# Patient Record
Sex: Female | Born: 1995 | Race: White | Hispanic: No | Marital: Single | Smoking: Never smoker
Health system: Southern US, Community
[De-identification: ages and names within clinical notes are randomized; demographics above are authoritative.]

## PROBLEM LIST (undated history)

## (undated) DIAGNOSIS — F329 Major depressive disorder, single episode, unspecified: Secondary | ICD-10-CM

## (undated) DIAGNOSIS — F32A Depression, unspecified: Secondary | ICD-10-CM

## (undated) DIAGNOSIS — T8859XA Other complications of anesthesia, initial encounter: Secondary | ICD-10-CM

## (undated) DIAGNOSIS — F411 Generalized anxiety disorder: Secondary | ICD-10-CM

## (undated) DIAGNOSIS — S62102A Fracture of unspecified carpal bone, left wrist, initial encounter for closed fracture: Secondary | ICD-10-CM

## (undated) DIAGNOSIS — S62109A Fracture of unspecified carpal bone, unspecified wrist, initial encounter for closed fracture: Secondary | ICD-10-CM

## (undated) HISTORY — DX: Generalized anxiety disorder: F41.1

## (undated) HISTORY — DX: Fracture of unspecified carpal bone, left wrist, initial encounter for closed fracture: S62.102A

## (undated) HISTORY — DX: Major depressive disorder, single episode, unspecified: F32.9

## (undated) HISTORY — DX: Depression, unspecified: F32.A

---
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
## 2017-02-20 ENCOUNTER — Encounter (HOSPITAL_COMMUNITY): Payer: Self-pay | Admitting: Emergency Medicine

## 2017-02-20 ENCOUNTER — Emergency Department (HOSPITAL_COMMUNITY)
Admission: EM | Admit: 2017-02-20 | Discharge: 2017-02-20 | Disposition: A | Discharge status: Home / Self Care | Payer: No Typology Code available for payment source | Attending: Emergency Medicine | Admitting: Emergency Medicine

## 2017-02-20 DIAGNOSIS — Y9389 Activity, other specified: Secondary | ICD-10-CM | POA: Insufficient documentation

## 2017-02-20 DIAGNOSIS — Y92410 Unspecified street and highway as the place of occurrence of the external cause: Secondary | ICD-10-CM | POA: Diagnosis not present

## 2017-02-20 DIAGNOSIS — Y999 Unspecified external cause status: Secondary | ICD-10-CM | POA: Diagnosis not present

## 2017-02-20 DIAGNOSIS — S0990XA Unspecified injury of head, initial encounter: Secondary | ICD-10-CM

## 2017-02-20 MED ORDER — ONDANSETRON 4 MG PO TBDP: 4.0000 mg | ORAL_TABLET | Freq: Three times a day (TID) | ORAL | 0 refills | Status: DC | PRN

## 2017-02-20 MED ORDER — NAPROXEN 500 MG PO TABS: 500.0000 mg | ORAL_TABLET | Freq: Two times a day (BID) | ORAL | 0 refills | Status: DC
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
## 2017-02-20 NOTE — Discharge Instructions (Signed)
Return to the ED with any concerns including vomiting, seizure activity, decreased level of alertness/lethargy, or any other alarming symptoms °

## 2017-02-20 NOTE — ED Notes (Signed)
Pt is in stable condition upon d/c and ambulates from ED. 
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
## 2020-12-28 ENCOUNTER — Encounter: Payer: Self-pay | Admitting: *Deleted

## 2020-12-29 ENCOUNTER — Encounter: Payer: Self-pay | Admitting: Cardiology

## 2020-12-29 ENCOUNTER — Ambulatory Visit: Payer: Self-pay | Admitting: Cardiology

## 2020-12-29 NOTE — Progress Notes (Deleted)
Cardiology Office Note  Date: 12/29/2020   ID: Stacie [name], DOB [birth year], MRN [id number]  PCP:  Richardean Chimera, MD  Cardiologist:  Nona Dell, MD Electrophysiologist:  None   No chief complaint on file.   History of Present Illness: Stacie [name] is a 25 y.o. female referred for cardiology consultation by Ms. Worley PA-C with [facility], history of palpitations and reported syncope.  Need family history  Past Medical History:  Diagnosis Date  . Depression   . GAD (generalized anxiety disorder)   . Left wrist fracture     Past Surgical History:  Procedure Laterality Date  . CLOSED REDUCTION WRIST FRACTURE Right 08/25/2013   Procedure: CLOSED REDUCTION WRIST;  Surgeon: Dominica Severin, MD;  Location: [facility] OR;  Service: Orthopedics;  Laterality: Right;  . KNEE SURGERY Right April 2013    Current Outpatient Medications  Medication Sig Dispense Refill  . clindamycin-benzoyl peroxide (BENZACLIN) gel Apply 1 application topically daily.    Marland Kitchen HYDROcodone-acetaminophen (NORCO/VICODIN) 5-325 MG per tablet Take 1 tablet by mouth every 4 (four) hours as needed. 30 tablet 0  . levonorgestrel-ethinyl estradiol (NORDETTE) 0.15-30 MG-MCG tablet Take 1 tablet by mouth daily.    . methocarbamol (ROBAXIN) 500 MG tablet Take 1 tablet (500 mg total) by mouth every 6 (six) hours as needed. 30 tablet 0  . naproxen (NAPROSYN) 500 MG tablet Take 1 tablet (500 mg total) by mouth 2 (two) times daily. 30 tablet 0  . ondansetron (ZOFRAN ODT) 4 MG disintegrating tablet Take 1 tablet (4 mg total) by mouth every 8 (eight) hours as needed for nausea or vomiting. 20 tablet 0   No current facility-administered medications for this visit.   Allergies:  Patient has no known allergies.   Social History: The patient  reports that she has never smoked. She has never used smokeless tobacco. She reports current drug use. Drug: Marijuana. She reports that she does not drink alcohol.   Family  History: The patient's family history is not on file.   ROS:  Please see the history of present illness. Otherwise, complete review of systems is positive for {NONE DEFAULTED:18576::"none"}.  All other systems are reviewed and negative.   Physical Exam: VS:  There were no vitals taken for this visit., BMI There is no height or weight on file to calculate BMI.  Wt Readings from Last 3 Encounters:  02/20/17 215 lb (97.5 kg) (98 %, Z= 2.15)*  08/26/13 160 lb (72.6 kg) (94 %, Z= 1.52)*   * Growth percentiles are based on CDC (Girls, 2-20 Years) data.    General: Patient appears comfortable at rest. HEENT: Conjunctiva and lids normal, oropharynx clear with moist mucosa. Neck: Supple, no elevated JVP or carotid bruits, no thyromegaly. Lungs: Clear to auscultation, nonlabored breathing at rest. Cardiac: Regular rate and rhythm, no S3 or significant systolic murmur, no pericardial rub. Abdomen: Soft, nontender, no hepatomegaly, bowel sounds present, no guarding or rebound. Extremities: No pitting edema, distal pulses 2+. Skin: Warm and dry. Musculoskeletal: No kyphosis. Neuropsychiatric: Alert and oriented x3, affect grossly appropriate.  ECG:  An ECG dated December 2021 was personally reviewed today and demonstrated:  Sinus arrhythmia.  Recent Labwork:  November 2021: BUN 5, creatinine 0.7, potassium 4.5, AST 25, ALT 28, hemoglobin 13, platelets 212, TSH 1.49  Other Studies Reviewed Today:  No prior cardiac testing for review.  Assessment and Plan:    Medication Adjustments/Labs and Tests Ordered: Current medicines are reviewed at length with the patient  today.  Concerns regarding medicines are outlined above.   Tests Ordered: No orders of the defined types were placed in this encounter.   Medication Changes: No orders of the defined types were placed in this encounter.   Disposition:  Follow up {follow up:15908}  Signed, Jonelle Sidle, MD, [facility] 12/29/2020 2:45 PM     [facility]  [facility] [address] [place], [place], [place] [address] Phone: [phone]; Fax: [phone]

## 2021-07-06 LAB — OB RESULTS CONSOLE HEPATITIS B SURFACE ANTIGEN: Hepatitis B Surface Ag: NEGATIVE

## 2021-07-06 LAB — OB RESULTS CONSOLE GC/CHLAMYDIA
Chlamydia: NEGATIVE
Gonorrhea: NEGATIVE

## 2021-07-06 LAB — OB RESULTS CONSOLE VARICELLA ZOSTER ANTIBODY, IGG: Varicella: IMMUNE

## 2021-07-06 LAB — OB RESULTS CONSOLE RUBELLA ANTIBODY, IGM: Rubella: IMMUNE

## 2021-07-06 LAB — OB RESULTS CONSOLE RPR: RPR: NONREACTIVE

## 2021-11-18 ENCOUNTER — Encounter (HOSPITAL_COMMUNITY): Payer: Self-pay | Admitting: Obstetrics

## 2021-11-18 ENCOUNTER — Inpatient Hospital Stay (HOSPITAL_COMMUNITY)
Admission: AD | Admit: 2021-11-18 | Discharge: 2021-11-18 | Disposition: A | Discharge status: Home / Self Care | Payer: Managed Care, Other (non HMO) | Attending: Obstetrics | Admitting: Obstetrics

## 2021-11-18 ENCOUNTER — Other Ambulatory Visit: Payer: Self-pay

## 2021-11-18 DIAGNOSIS — O133 Gestational [pregnancy-induced] hypertension without significant proteinuria, third trimester: Secondary | ICD-10-CM | POA: Diagnosis present

## 2021-11-18 DIAGNOSIS — R519 Headache, unspecified: Secondary | ICD-10-CM

## 2021-11-18 DIAGNOSIS — Z3A29 29 weeks gestation of pregnancy: Secondary | ICD-10-CM

## 2021-11-18 DIAGNOSIS — O26893 Other specified pregnancy related conditions, third trimester: Secondary | ICD-10-CM

## 2021-11-18 LAB — COMPREHENSIVE METABOLIC PANEL
ALT: 25 U/L (ref 0–44)
AST: 18 U/L (ref 15–41)
Albumin: 2.9 g/dL — ABNORMAL LOW (ref 3.5–5.0)
Alkaline Phosphatase: 88 U/L (ref 38–126)
Anion gap: 9 (ref 5–15)
BUN: 6 mg/dL (ref 6–20)
CO2: 20 mmol/L — ABNORMAL LOW (ref 22–32)
Calcium: 8.9 mg/dL (ref 8.9–10.3)
Chloride: 108 mmol/L (ref 98–111)
Creatinine, Ser: 0.58 mg/dL (ref 0.44–1.00)
GFR, Estimated: >60 mL/min (ref >60)
Glucose, Bld: 84 mg/dL (ref 70–99)
Potassium: 3.6 mmol/L (ref 3.5–5.1)
Sodium: 137 mmol/L (ref 135–145)
Total Bilirubin: 0.3 mg/dL (ref 0.3–1.2)
Total Protein: 6.4 g/dL — ABNORMAL LOW (ref 6.5–8.1)

## 2021-11-18 LAB — CBC
HCT: 34.1 % — ABNORMAL LOW (ref 36.0–46.0)
Hemoglobin: 11 g/dL — ABNORMAL LOW (ref 12.0–15.0)
MCH: 26.7 pg (ref 26.0–34.0)
MCHC: 32.3 g/dL (ref 30.0–36.0)
MCV: 82.8 fL (ref 80.0–100.0)
Platelets: 221 10*3/uL (ref 150–400)
RBC: 4.12 MIL/uL (ref 3.87–5.11)
RDW: 13.2 % (ref 11.5–15.5)
WBC: 13.1 10*3/uL — ABNORMAL HIGH (ref 4.0–10.5)
nRBC: 0 % (ref 0.0–0.2)

## 2021-11-18 LAB — URINALYSIS, ROUTINE W REFLEX MICROSCOPIC
Bilirubin Urine: NEGATIVE
Glucose, UA: NEGATIVE mg/dL
Hgb urine dipstick: NEGATIVE
Ketones, ur: NEGATIVE mg/dL
Leukocytes,Ua: NEGATIVE
Nitrite: NEGATIVE
Protein, ur: NEGATIVE mg/dL
Specific Gravity, Urine: 1.01 (ref 1.005–1.030)
pH: 6.5 (ref 5.0–8.0)

## 2021-11-18 LAB — PROTEIN / CREATININE RATIO, URINE
Creatinine, Urine: 51.64 mg/dL
Total Protein, Urine: <6 mg/dL

## 2021-11-18 MED ORDER — METOCLOPRAMIDE HCL 10 MG PO TABS: 10.0000 mg | ORAL_TABLET | Freq: Four times a day (QID) | ORAL | 0 refills | Status: DC | PRN

## 2021-11-18 MED ORDER — ACETAMINOPHEN 325 MG PO TABS
650.0000 mg | ORAL_TABLET | Freq: Once | ORAL | Status: AC
Start: 2021-11-18 — End: 2021-11-18
  Administered 2021-11-18: 650 mg via ORAL
  Filled 2021-11-18: qty 2

## 2021-11-18 NOTE — MAU Note (Signed)
Stacie [name] is a 25 y.o. at [redacted]w[redacted]d here in MAU reporting: Symptomatic yesterday around 3-4pm. HA, spots in vision that comes and goes.  Onset of complaint: 12/14 @ 3pm Pain score: 3/10 Vitals:   11/18/21 1954  BP: (!) 156/82  Pulse: (!) 114  Resp: 18  Temp: 98.2 F (36.8 C)  SpO2: 98%     FHT:146 Lab orders placed from triage: urinalysis

## 2021-11-18 NOTE — MAU Provider Note (Addendum)
Patient Stacie [name] is a 25 y.o. G1P0  At [redacted]w[redacted]d here with complaints of elevated BP at work today and at home. She reports that she feels that she has floating spots when her blood pressure spikes; she does not have any right now. She reports that she has a HA right now.  "They never stay more than 5 minutes at a time". She feels like the baby's movements decrease when her BP is elevated.   No elevated blood pressures at [facility]. She took her BP at work today and the readings were 157/85; 142/90. She checked her blood pressure today at the clinic bc she felt like something was "not right". She   Last reading at home was 144/101 at home at 1754.   She denies LOF, VB, dysuria.    She reports that her HA  and BP is situational.  She reports a significant amount of stress at home.  History     CSN: [id number]  Arrival date and time: 11/18/21 1919   Event Date/Time   First Provider Initiated Contact with Patient 11/18/21 2038      Chief Complaint  Patient presents with   Hypertension   Hypertension This is a new problem. The current episode started 1 to 4 weeks ago (reports that her BP is elevated at home due to situation). The problem is unchanged. Associated symptoms include headaches. Pertinent negatives include no blurred vision or shortness of breath.   OB History     Gravida  1   Para      Term      Preterm      AB      Living         SAB      IAB      Ectopic      Multiple      Live Births              Past Medical History:  Diagnosis Date   Depression    GAD (generalized anxiety disorder)    Left wrist fracture     Past Surgical History:  Procedure Laterality Date   CLOSED REDUCTION WRIST FRACTURE Right 08/25/2013   Procedure: CLOSED REDUCTION WRIST;  Surgeon: Dominica Severin, MD;  Location: [facility] OR;  Service: Orthopedics;  Laterality: Right;   KNEE SURGERY Right April 2013    History reviewed. No pertinent family  history.  Social History   Tobacco Use   Smoking status: Never   Smokeless tobacco: Never  Vaping Use   Vaping Use: Some days  Substance Use Topics   Alcohol use: No   Drug use: Yes    Types: Marijuana    Allergies:  Allergies  Allergen Reactions   Latex Rash    Medications Prior to Admission  Medication Sig Dispense Refill Last Dose   ondansetron (ZOFRAN ODT) 4 MG disintegrating tablet Take 1 tablet (4 mg total) by mouth every 8 (eight) hours as needed for nausea or vomiting. 20 tablet 0 Past Week   clindamycin-benzoyl peroxide (BENZACLIN) gel Apply 1 application topically daily.      HYDROcodone-acetaminophen (NORCO/VICODIN) 5-325 MG per tablet Take 1 tablet by mouth every 4 (four) hours as needed. 30 tablet 0    levonorgestrel-ethinyl estradiol (NORDETTE) 0.15-30 MG-MCG tablet Take 1 tablet by mouth daily.      methocarbamol (ROBAXIN) 500 MG tablet Take 1 tablet (500 mg total) by mouth every 6 (six) hours as needed. 30 tablet 0    naproxen (NAPROSYN)  500 MG tablet Take 1 tablet (500 mg total) by mouth 2 (two) times daily. 30 tablet 0     Review of Systems  Constitutional: Negative.   HENT: Negative.    Eyes:  Negative for blurred vision.  Respiratory:  Negative for shortness of breath.   Cardiovascular: Negative.   Gastrointestinal: Negative.   Genitourinary: Negative.   Musculoskeletal: Negative.   Neurological:  Positive for headaches.  Psychiatric/Behavioral: Negative.    Physical Exam   Blood pressure 134/71, pulse 79, temperature 98.2 F (36.8 C), temperature source Oral, resp. rate 18, height 5\' 10"  (1.778 m), weight 105.9 kg, SpO2 98 %.  Physical Exam Constitutional:      Appearance: Normal appearance.  Cardiovascular:     Rate and Rhythm: Normal rate.  Pulmonary:     Effort: Pulmonary effort is normal.  Abdominal:     General: Abdomen is flat.  Skin:    General: Skin is warm.     Capillary Refill: Capillary refill takes less than 2 seconds.   Neurological:     Mental Status: She is alert.  Psychiatric:        Mood and Affect: Mood normal.    MAU Course  Procedures  MDM -will draw pre-e labs  -will order tylenol for patient  -NST :  Assessment and Plan  Patient care endorsed to [facility] [place] 11/18/2021, 11:34 PM   MDM: Pt reports complete resolution of h/a with Tylenol.  PEC labs wnl.  Discussed pt stress at home. Dx today is transient HTN, does not meet critera for GHTN with BP usually normal in the office.  Will continue to watch.  Pt to keep prenatal appts as scheduled. Take Tylenol PRN for h/a, and Rx for Reglan sent for headache tx as well.  Return to MAU as needed for emergencies.   A/P: 1. Headache in pregnancy, third trimester   2. Transient hypertension of pregnancy in third trimester   3. [redacted] weeks gestation of pregnancy     D/C home with HTN precautions  11/20/2021, CNM 11:34 PM

## 2021-12-05 NOTE — L&D Delivery Note (Signed)
Delivery Note At 2:41 PM a viable and healthy female was delivered via Vaginal, Spontaneous (Presentation: Right Occiput Anterior).  APGAR: 8, 9; weight 8 lb 3.2 oz (3720 g).   Placenta status: Spontaneous, Intact.  Cord: 3 vessels a body cord  The patient pushed for approximately 30 minutes and delivered a vigorous female infant in the vertex ROA presentation with Apgar scores of 8 at 1 minute and 9 at 5 minutes.  The infant was passed to the waiting maternal abdomen.  Following a 1 minute delay in cord clamping, the umbilical cord was clamped and cut.  The placenta was delivered spontaneously, intact, with three-vessel cord.  A second-degree perineal laceration was repaired with 3-0 Vicryl.  A small amount of residual oozing was noted at the inferior portion of the perineal laceration.  A figure-of-eight suture was placed.  1 g of TXA was administered.  Mom and baby are doing well following delivery.  Anesthesia: Epidural Episiotomy: None Lacerations: 2nd degree Suture Repair: 3.0 vicryl Est. Blood Loss (mL): 307  Mom to postpartum.  Baby to Couplet care / Skin to Skin.  Waynard Reeds 01/24/2022, 3:58 PM

## 2022-01-13 LAB — OB RESULTS CONSOLE GBS: GBS: NEGATIVE

## 2022-01-23 ENCOUNTER — Inpatient Hospital Stay (HOSPITAL_COMMUNITY)
Admission: AD | Admit: 2022-01-23 | Discharge: 2022-01-25 | DRG: 807 | Disposition: A | Discharge status: Home / Self Care | Payer: Medicaid Other | Source: Ambulatory Visit | Attending: Obstetrics and Gynecology | Admitting: Obstetrics and Gynecology

## 2022-01-23 ENCOUNTER — Encounter (HOSPITAL_COMMUNITY): Payer: Self-pay | Admitting: Obstetrics and Gynecology

## 2022-01-23 ENCOUNTER — Other Ambulatory Visit: Payer: Self-pay

## 2022-01-23 DIAGNOSIS — O134 Gestational [pregnancy-induced] hypertension without significant proteinuria, complicating childbirth: Secondary | ICD-10-CM | POA: Diagnosis present

## 2022-01-23 DIAGNOSIS — O133 Gestational [pregnancy-induced] hypertension without significant proteinuria, third trimester: Secondary | ICD-10-CM

## 2022-01-23 DIAGNOSIS — O368130 Decreased fetal movements, third trimester, not applicable or unspecified: Secondary | ICD-10-CM | POA: Diagnosis present

## 2022-01-23 DIAGNOSIS — O368190 Decreased fetal movements, unspecified trimester, not applicable or unspecified: Secondary | ICD-10-CM | POA: Diagnosis not present

## 2022-01-23 DIAGNOSIS — O99344 Other mental disorders complicating childbirth: Secondary | ICD-10-CM | POA: Diagnosis present

## 2022-01-23 DIAGNOSIS — F419 Anxiety disorder, unspecified: Secondary | ICD-10-CM | POA: Diagnosis present

## 2022-01-23 DIAGNOSIS — Z20822 Contact with and (suspected) exposure to covid-19: Secondary | ICD-10-CM | POA: Diagnosis present

## 2022-01-23 DIAGNOSIS — Z3A38 38 weeks gestation of pregnancy: Secondary | ICD-10-CM

## 2022-01-23 HISTORY — DX: Other complications of anesthesia, initial encounter: T88.59XA

## 2022-01-23 LAB — COMPREHENSIVE METABOLIC PANEL
ALT: 14 U/L (ref 0–44)
AST: 18 U/L (ref 15–41)
Albumin: 2.9 g/dL — ABNORMAL LOW (ref 3.5–5.0)
Alkaline Phosphatase: 135 U/L — ABNORMAL HIGH (ref 38–126)
Anion gap: 10 (ref 5–15)
BUN: 10 mg/dL (ref 6–20)
CO2: 20 mmol/L — ABNORMAL LOW (ref 22–32)
Calcium: 9.2 mg/dL (ref 8.9–10.3)
Chloride: 107 mmol/L (ref 98–111)
Creatinine, Ser: 0.69 mg/dL (ref 0.44–1.00)
GFR, Estimated: >60 mL/min (ref >60)
Glucose, Bld: 86 mg/dL (ref 70–99)
Potassium: 3.6 mmol/L (ref 3.5–5.1)
Sodium: 137 mmol/L (ref 135–145)
Total Bilirubin: 0.1 mg/dL — ABNORMAL LOW (ref 0.3–1.2)
Total Protein: 6.5 g/dL (ref 6.5–8.1)

## 2022-01-23 LAB — CBC WITH DIFFERENTIAL/PLATELET
Abs Immature Granulocytes: 0.05 10*3/uL (ref 0.00–0.07)
Basophils Absolute: 0 10*3/uL (ref 0.0–0.1)
Basophils Relative: 0 %
Eosinophils Absolute: 0.1 10*3/uL (ref 0.0–0.5)
Eosinophils Relative: 1 %
HCT: 34.7 % — ABNORMAL LOW (ref 36.0–46.0)
Hemoglobin: 10.9 g/dL — ABNORMAL LOW (ref 12.0–15.0)
Immature Granulocytes: 0 %
Lymphocytes Relative: 22 %
Lymphs Abs: 2.5 10*3/uL (ref 0.7–4.0)
MCH: 24.4 pg — ABNORMAL LOW (ref 26.0–34.0)
MCHC: 31.4 g/dL (ref 30.0–36.0)
MCV: 77.8 fL — ABNORMAL LOW (ref 80.0–100.0)
Monocytes Absolute: 0.7 10*3/uL (ref 0.1–1.0)
Monocytes Relative: 6 %
Neutro Abs: 8.1 10*3/uL — ABNORMAL HIGH (ref 1.7–7.7)
Neutrophils Relative %: 71 %
Platelets: 216 10*3/uL (ref 150–400)
RBC: 4.46 MIL/uL (ref 3.87–5.11)
RDW: 15.5 % (ref 11.5–15.5)
WBC: 11.4 10*3/uL — ABNORMAL HIGH (ref 4.0–10.5)
nRBC: 0 % (ref 0.0–0.2)

## 2022-01-23 LAB — TYPE AND SCREEN
ABO/RH(D): A POS
Antibody Screen: NEGATIVE

## 2022-01-23 LAB — RESP PANEL BY RT-PCR (FLU A&B, COVID) ARPGX2
Influenza A by PCR: NEGATIVE
Influenza B by PCR: NEGATIVE
SARS Coronavirus 2 by RT PCR: NEGATIVE

## 2022-01-23 LAB — PROTEIN / CREATININE RATIO, URINE
Creatinine, Urine: 139.02 mg/dL
Protein Creatinine Ratio: 0.14 mg/mg{Cre} (ref 0.00–0.15)
Total Protein, Urine: 19 mg/dL

## 2022-01-23 LAB — POCT FERN TEST: POCT Fern Test: NEGATIVE

## 2022-01-23 MED ORDER — ACETAMINOPHEN 325 MG PO TABS
650.0000 mg | ORAL_TABLET | ORAL | Status: DC | PRN
  Administered 2022-01-24: 650 mg via ORAL
  Filled 2022-01-23: qty 2

## 2022-01-23 MED ORDER — SOD CITRATE-CITRIC ACID 500-334 MG/5ML PO SOLN: 30.0000 mL | ORAL | Status: DC | PRN

## 2022-01-23 MED ORDER — OXYCODONE-ACETAMINOPHEN 5-325 MG PO TABS: 1.0000 | ORAL_TABLET | ORAL | Status: DC | PRN

## 2022-01-23 MED ORDER — OXYCODONE-ACETAMINOPHEN 5-325 MG PO TABS: 2.0000 | ORAL_TABLET | ORAL | Status: DC | PRN

## 2022-01-23 MED ORDER — LACTATED RINGERS IV SOLN: 500.0000 mL | INTRAVENOUS | Status: DC | PRN

## 2022-01-23 MED ORDER — LACTATED RINGERS IV SOLN: INTRAVENOUS | Status: DC

## 2022-01-23 MED ORDER — OXYTOCIN BOLUS FROM INFUSION
333.0000 mL | Freq: Once | INTRAVENOUS | Status: AC
  Administered 2022-01-24: 333 mL via INTRAVENOUS

## 2022-01-23 MED ORDER — FLEET ENEMA 7-19 GM/118ML RE ENEM: 1.0000 | ENEMA | RECTAL | Status: DC | PRN

## 2022-01-23 MED ORDER — OXYTOCIN-SODIUM CHLORIDE 30-0.9 UT/500ML-% IV SOLN
1.0000 m[IU]/min | INTRAVENOUS | Status: DC
  Administered 2022-01-23: 2 m[IU]/min via INTRAVENOUS
  Filled 2022-01-23: qty 500

## 2022-01-23 MED ORDER — ONDANSETRON HCL 4 MG/2ML IJ SOLN
4.0000 mg | Freq: Four times a day (QID) | INTRAMUSCULAR | Status: DC | PRN
  Administered 2022-01-24: 4 mg via INTRAVENOUS
  Filled 2022-01-23: qty 2

## 2022-01-23 MED ORDER — LIDOCAINE HCL (PF) 1 % IJ SOLN
30.0000 mL | INTRAMUSCULAR | Status: AC | PRN
  Administered 2022-01-24: 30 mL via SUBCUTANEOUS
  Filled 2022-01-23: qty 30

## 2022-01-23 MED ORDER — OXYTOCIN-SODIUM CHLORIDE 30-0.9 UT/500ML-% IV SOLN
2.5000 [IU]/h | INTRAVENOUS | Status: DC
  Administered 2022-01-24: 2.5 [IU]/h via INTRAVENOUS
  Filled 2022-01-23: qty 500

## 2022-01-23 MED ORDER — TERBUTALINE SULFATE 1 MG/ML IJ SOLN: 0.2500 mg | Freq: Once | INTRAMUSCULAR | Status: DC | PRN

## 2022-01-23 NOTE — MAU Provider Note (Signed)
Pt informed that the ultrasound is considered a limited OB ultrasound and is not intended to be a complete ultrasound exam.  Patient also informed that the ultrasound is not being completed with the intent of assessing for fetal or placental anomalies or any pelvic abnormalities.  Explained that the purpose of todays ultrasound is to assess for  presentation.  Patient acknowledges the purpose of the exam and the limitations of the study.    Vertex position.    Exam Noni Saupe, NP

## 2022-01-23 NOTE — MAU Provider Note (Signed)
S Stacie [name] is a 25 y.o. G1P0 patient who presents to MAU today with complaint of DFM and elevated BP's.  O BP (!) 147/94    Pulse 92    Temp 98.3 F (36.8 C) (Oral)    Resp 18    Ht 5\' 10"  (1.778 m)    Wt 115 kg    SpO2 99%    BMI 36.37 kg/m   Patient Vitals for the past 24 hrs:  BP Temp Temp src Pulse Resp SpO2 Height Weight  01/23/22 2035 (!) 147/94 -- -- 92 -- 99 % -- --  01/23/22 2014 (!) 142/96 98.3 F (36.8 C) Oral 97 18 -- 5\' 10"  (1.778 m) 115 kg     Physical Exam Skin:    General: Skin is warm.  Neurological:     Mental Status: She is alert and oriented to person, place, and time.  Psychiatric:        Behavior: Behavior normal.    A Medical screening exam complete DFM and GHTN @ [redacted]w[redacted]d  P  Admit to labor. Pre E labs. Fern slide negative     Lezlie Lye, NP 01/23/2022 8:52 PM

## 2022-01-23 NOTE — MAU Note (Signed)
...  Stacie [name] is a 26 y.o. at [redacted]w[redacted]d here in MAU reporting: DFM since 1630 with light to no movement, and reports less movement throughout the day earlier. Pt states around 0900 pt wake up with watery odorless fluid in her underwear, and it has continued all day as she has had to change her underwear 3 times. Pt is not wearing a pad. Pt denies VB, abnormal discharge, no ctx and complications in the pregnancy. Pt has high anxiety throughout the pregnancy.  Last intercourse was 2 weeks ago.  SVE last Thursday was 3cm Onset of complaint: 0900 Pain score: 0/10 Vitals:   01/23/22 2014  BP: (!) 142/96  Pulse: 97  Resp: 18  Temp: 98.3 F (36.8 C)     FHT:140 Lab orders placed from triage:  none

## 2022-01-24 ENCOUNTER — Inpatient Hospital Stay (HOSPITAL_COMMUNITY): Payer: Medicaid Other | Admitting: Anesthesiology

## 2022-01-24 LAB — CBC
HCT: 31.8 % — ABNORMAL LOW (ref 36.0–46.0)
HCT: 30.5 % — ABNORMAL LOW (ref 36.0–46.0)
Hemoglobin: 9.9 g/dL — ABNORMAL LOW (ref 12.0–15.0)
Hemoglobin: 9.6 g/dL — ABNORMAL LOW (ref 12.0–15.0)
MCH: 24.4 pg — ABNORMAL LOW (ref 26.0–34.0)
MCH: 24.5 pg — ABNORMAL LOW (ref 26.0–34.0)
MCHC: 31.1 g/dL (ref 30.0–36.0)
MCHC: 31.5 g/dL (ref 30.0–36.0)
MCV: 78.3 fL — ABNORMAL LOW (ref 80.0–100.0)
MCV: 77.8 fL — ABNORMAL LOW (ref 80.0–100.0)
Platelets: 197 10*3/uL (ref 150–400)
Platelets: 185 10*3/uL (ref 150–400)
RBC: 4.06 MIL/uL (ref 3.87–5.11)
RBC: 3.92 MIL/uL (ref 3.87–5.11)
RDW: 15.6 % — ABNORMAL HIGH (ref 11.5–15.5)
RDW: 15.4 % (ref 11.5–15.5)
WBC: 10.7 10*3/uL — ABNORMAL HIGH (ref 4.0–10.5)
WBC: 12.7 10*3/uL — ABNORMAL HIGH (ref 4.0–10.5)
nRBC: 0 % (ref 0.0–0.2)
nRBC: 0 % (ref 0.0–0.2)

## 2022-01-24 LAB — OB RESULTS CONSOLE GC/CHLAMYDIA
Chlamydia: NEGATIVE
Gonorrhea: NEGATIVE

## 2022-01-24 LAB — RPR: RPR Ser Ql: NONREACTIVE

## 2022-01-24 MED ORDER — COCONUT OIL OIL
1.0000 "application " | TOPICAL_OIL | Status: DC | PRN
  Administered 2022-01-25: 1 via TOPICAL

## 2022-01-24 MED ORDER — EPHEDRINE 5 MG/ML INJ: 10.0000 mg | INTRAVENOUS | Status: DC | PRN

## 2022-01-24 MED ORDER — BENZOCAINE-MENTHOL 20-0.5 % EX AERO
1.0000 "application " | INHALATION_SPRAY | CUTANEOUS | Status: DC | PRN
  Administered 2022-01-24: 1 via TOPICAL
  Filled 2022-01-24: qty 56

## 2022-01-24 MED ORDER — ZOLPIDEM TARTRATE 5 MG PO TABS: 5.0000 mg | ORAL_TABLET | Freq: Every evening | ORAL | Status: DC | PRN

## 2022-01-24 MED ORDER — TRANEXAMIC ACID-NACL 1000-0.7 MG/100ML-% IV SOLN
INTRAVENOUS | Status: AC
  Filled 2022-01-24: qty 100

## 2022-01-24 MED ORDER — ACETAMINOPHEN 325 MG PO TABS
650.0000 mg | ORAL_TABLET | ORAL | Status: DC | PRN
  Administered 2022-01-24 – 2022-01-25 (×2): 650 mg via ORAL
  Filled 2022-01-24 (×2): qty 2

## 2022-01-24 MED ORDER — METHYLERGONOVINE MALEATE 0.2 MG PO TABS: 0.2000 mg | ORAL_TABLET | ORAL | Status: DC | PRN

## 2022-01-24 MED ORDER — SIMETHICONE 80 MG PO CHEW: 80.0000 mg | CHEWABLE_TABLET | ORAL | Status: DC | PRN

## 2022-01-24 MED ORDER — LABETALOL HCL 5 MG/ML IV SOLN: 80.0000 mg | INTRAVENOUS | Status: DC | PRN

## 2022-01-24 MED ORDER — LABETALOL HCL 5 MG/ML IV SOLN: 20.0000 mg | INTRAVENOUS | Status: DC | PRN

## 2022-01-24 MED ORDER — LACTATED RINGERS IV SOLN: 500.0000 mL | Freq: Once | INTRAVENOUS | Status: DC

## 2022-01-24 MED ORDER — HYDRALAZINE HCL 20 MG/ML IJ SOLN: 10.0000 mg | INTRAMUSCULAR | Status: DC | PRN

## 2022-01-24 MED ORDER — DIBUCAINE (PERIANAL) 1 % EX OINT: 1.0000 "application " | TOPICAL_OINTMENT | CUTANEOUS | Status: DC | PRN

## 2022-01-24 MED ORDER — FENTANYL-BUPIVACAINE-NACL 0.5-0.125-0.9 MG/250ML-% EP SOLN
12.0000 mL/h | EPIDURAL | Status: DC | PRN
  Administered 2022-01-24: 12 mL/h via EPIDURAL
  Filled 2022-01-24: qty 250

## 2022-01-24 MED ORDER — TRANEXAMIC ACID-NACL 1000-0.7 MG/100ML-% IV SOLN
1000.0000 mg | INTRAVENOUS | Status: AC
  Administered 2022-01-24: 1000 mg via INTRAVENOUS

## 2022-01-24 MED ORDER — ONDANSETRON HCL 4 MG PO TABS
4.0000 mg | ORAL_TABLET | ORAL | Status: DC | PRN
Start: 2022-01-24 — End: 2022-01-26

## 2022-01-24 MED ORDER — DIPHENHYDRAMINE HCL 25 MG PO CAPS: 25.0000 mg | ORAL_CAPSULE | Freq: Four times a day (QID) | ORAL | Status: DC | PRN

## 2022-01-24 MED ORDER — METHYLERGONOVINE MALEATE 0.2 MG/ML IJ SOLN: 0.2000 mg | INTRAMUSCULAR | Status: DC | PRN

## 2022-01-24 MED ORDER — PHENYLEPHRINE 40 MCG/ML (10ML) SYRINGE FOR IV PUSH (FOR BLOOD PRESSURE SUPPORT): 80.0000 ug | PREFILLED_SYRINGE | INTRAVENOUS | Status: DC | PRN

## 2022-01-24 MED ORDER — ONDANSETRON HCL 4 MG/2ML IJ SOLN: 4.0000 mg | INTRAMUSCULAR | Status: DC | PRN

## 2022-01-24 MED ORDER — SENNOSIDES-DOCUSATE SODIUM 8.6-50 MG PO TABS
2.0000 | ORAL_TABLET | Freq: Every day | ORAL | Status: DC
  Administered 2022-01-25: 2 via ORAL
  Filled 2022-01-24: qty 2

## 2022-01-24 MED ORDER — DIPHENHYDRAMINE HCL 50 MG/ML IJ SOLN: 12.5000 mg | INTRAMUSCULAR | Status: DC | PRN

## 2022-01-24 MED ORDER — FENTANYL CITRATE (PF) 100 MCG/2ML IJ SOLN
100.0000 ug | INTRAMUSCULAR | Status: DC | PRN
  Administered 2022-01-24: 100 ug via INTRAVENOUS
  Filled 2022-01-24: qty 2

## 2022-01-24 MED ORDER — TETANUS-DIPHTH-ACELL PERTUSSIS 5-2.5-18.5 LF-MCG/0.5 IM SUSY: 0.5000 mL | PREFILLED_SYRINGE | Freq: Once | INTRAMUSCULAR | Status: DC

## 2022-01-24 MED ORDER — LIDOCAINE HCL (PF) 1 % IJ SOLN
INTRAMUSCULAR | Status: DC | PRN
Start: 2022-01-24 — End: 2022-01-24
  Administered 2022-01-24: 10 mL via EPIDURAL

## 2022-01-24 MED ORDER — IBUPROFEN 600 MG PO TABS
600.0000 mg | ORAL_TABLET | Freq: Four times a day (QID) | ORAL | Status: DC
  Administered 2022-01-24 – 2022-01-25 (×5): 600 mg via ORAL
  Filled 2022-01-24 (×5): qty 1

## 2022-01-24 MED ORDER — PHENYLEPHRINE 40 MCG/ML (10ML) SYRINGE FOR IV PUSH (FOR BLOOD PRESSURE SUPPORT)
80.0000 ug | PREFILLED_SYRINGE | INTRAVENOUS | Status: AC | PRN
  Administered 2022-01-24 (×3): 80 ug via INTRAVENOUS
  Filled 2022-01-24: qty 10

## 2022-01-24 MED ORDER — LABETALOL HCL 5 MG/ML IV SOLN: 40.0000 mg | INTRAVENOUS | Status: DC | PRN

## 2022-01-24 MED ORDER — PRENATAL MULTIVITAMIN CH
1.0000 | ORAL_TABLET | Freq: Every day | ORAL | Status: DC
  Administered 2022-01-25: 1 via ORAL
  Filled 2022-01-24: qty 1

## 2022-01-24 MED ORDER — WITCH HAZEL-GLYCERIN EX PADS: 1.0000 "application " | MEDICATED_PAD | CUTANEOUS | Status: DC | PRN

## 2022-01-24 NOTE — Lactation Note (Signed)
This note was copied from a baby's chart. Lactation Consultation Note  Patient Name: Stacie [name] [id number] Date: 01/24/2022 Reason for consult: L&D Initial assessment;Mother's request;Primapara;1st time breastfeeding;Early term 37-38.6wks Age:24 hours  LC assisted with latching infant in cross cradle prone with signs of milk transfer.  Infant still feeding at the end of the visit.  Mom to get more LC support on the floor.   Maternal Data    Feeding Mother's Current Feeding Choice: Breast Milk  LATCH Score Latch: Repeated attempts needed to sustain latch, nipple held in mouth throughout feeding, stimulation needed to elicit sucking reflex.  Audible Swallowing: Spontaneous and intermittent  Type of Nipple: Everted at rest and after stimulation  Comfort (Breast/Nipple): Soft / non-tender  Hold (Positioning): Assistance needed to correctly position infant at breast and maintain latch.  LATCH Score: 8   Lactation Tools Discussed/Used    Interventions Interventions: Breast feeding basics reviewed;Assisted with latch;Skin to skin;Adjust position;Education;Infant Driven Feeding Algorithm education  Discharge Pump: Personal  Consult Status Consult Status: Follow-up from L&D Date: 01/25/22 Follow-up type: In-patient    Stacie Egler  [name] 01/24/2022, 3:41 PM

## 2022-01-24 NOTE — H&P (Signed)
MATISHA DEMAYO is a 26 y.o. female presenting for reported DFM this afternoon as well as r/o ROM. Paged on-call earlier regarding possible ROM, thin mucus per patient, clear with no odor. Denies VB, only irr ctx. Was noted to have elevated BP in MAU; no diagnosed HTN but does have significant anxiety  PNC c/b anxiety on Lexapro and PRN hydralazine, h/o tobacco use, infant rt renal pelvis 57mm (pp follow up)  GBS neg  OB History     Gravida  1   Para      Term      Preterm      AB      Living         SAB      IAB      Ectopic      Multiple      Live Births             Past Medical History:  Diagnosis Date   Complication of anesthesia    Depression    GAD (generalized anxiety disorder)    Left wrist fracture    Past Surgical History:  Procedure Laterality Date   CLOSED REDUCTION WRIST FRACTURE Right 08/25/2013   Procedure: CLOSED REDUCTION WRIST;  Surgeon: Roseanne Kaufman, MD;  Location: [facility];  Service: Orthopedics;  Laterality: Right;   KNEE SURGERY Right April 2013   Family History: family history is not on file. Social History:  reports that she has never smoked. She has never used smokeless tobacco. She reports that she does not currently use drugs after having used the following drugs: Marijuana. She reports that she does not drink alcohol.     Maternal Diabetes: No Genetic Screening: Normal Maternal Ultrasounds/Referrals: Fetal renal pyelectasis Fetal Ultrasounds or other Referrals:  None Maternal Substance Abuse:  Yes:  Type: Smoker Significant Maternal Medications:  Meds include: Other: Lexapro Significant Maternal Lab Results:  Group B Strep negative Other Comments:  None  Review of Systems  Constitutional:  Negative for chills and fever.  Respiratory:  Negative for shortness of breath.   Cardiovascular:  Negative for chest pain, palpitations and leg swelling.  Gastrointestinal:  Negative for abdominal pain and vomiting.  Neurological:   Negative for dizziness, weakness and headaches.  Psychiatric/Behavioral:  Negative for suicidal ideas.   Maternal Medical History:  Contractions: Frequency: irregular.   Fetal activity: Perceived fetal activity is decreased.   Last perceived fetal movement was within the past hour.   Prenatal complications: PIH (?white coat vs GHTN).   No bleeding.   Prenatal Complications - Diabetes: none.  Dilation: 3 Effacement (%): 70 Station: -3 Exam by:: Thersa Salt, RN Blood pressure (!) 142/90, pulse 79, temperature 98 F (36.7 C), temperature source Oral, resp. rate 16, height 5\' 10"  (1.778 m), weight 115 kg, SpO2 99 %. Exam Physical Exam Constitutional:      General: She is not in acute distress.    Appearance: She is well-developed.  HENT:     Head: Normocephalic and atraumatic.  Eyes:     Pupils: Pupils are equal, round, and reactive to light.  Cardiovascular:     Rate and Rhythm: Normal rate and regular rhythm.     Heart sounds: No murmur heard.   No gallop.  Abdominal:     Tenderness: There is no abdominal tenderness. There is no guarding or rebound.  Genitourinary:    Vagina: Normal.  Musculoskeletal:        General: Normal range of motion.  Cervical back: Normal range of motion and neck supple.  Skin:    General: Skin is warm and dry.  Neurological:     Mental Status: She is alert and oriented to person, place, and time.    Prenatal labs: ABO, Rh: --/--/A POS (02/19 2115) Antibody: NEG (02/19 2115) Rubella:  imm RPR:   nr HBsAg:   neg HIV:   nr GBS:   NEG Cat 1 tracing TOCO w/ irritability Assessment/Plan: This is a 26yo G1P0 @ 31 6/7 with newly diagnosed GHTN (PreE labs WNL, asx) as wlel as DFM at term. Admitted for IOL, GBS neg. Plan for pitocin per protocol. Amenable to nitrous vs epidural. Anticipate SVD   Melida Quitter Loranda Mastel 01/24/2022, 2:10 AM

## 2022-01-24 NOTE — Lactation Note (Signed)
This note was copied from a baby's chart. Lactation Consultation Note  Patient Name: Stacie [name] [id number] Date: 01/24/2022 Reason for consult: Initial assessment;1st time breastfeeding;Early term 37-38.6wks Age:24 hours P1, ETI female infant. Per mom, she feels breastfeeding is going well she feels tug and no pain with latch. Infant was latched prior to [facility] entering the  room, infant BF for 15 mins on mom's right breast using cradle hold position. While LC was in room mom switched infant to her left breast using cradle hold postion, infant was still breastfeeding after 20 minutes. Mom knows to attempt to latch infant on both breast during a feeding. Mom will continue to breastfeed infant according to hunger cues, 8 to 12+ times within 24 hours, skin to skin. Mom knows to call RN/LC if she has any breastfeeding questions, concerns or need assistance with latching infant at the breast.  Mom made aware of O/P services, breastfeeding support groups, community resources, and our phone # for post-discharge questions.   Maternal Data Has patient been taught Hand Expression?: Yes Does the patient have breastfeeding experience prior to this delivery?: No  Feeding Mother's Current Feeding Choice: Breast Milk  LATCH Score Latch: Grasps breast easily, tongue down, lips flanged, rhythmical sucking.  Audible Swallowing: Spontaneous and intermittent  Type of Nipple: Everted at rest and after stimulation  Comfort (Breast/Nipple): Soft / non-tender  Hold (Positioning): No assistance needed to correctly position infant at breast.  LATCH Score: 10   Lactation Tools Discussed/Used    Interventions Interventions: Breast feeding basics reviewed;Skin to skin;Hand express;Breast compression;Adjust position;Support pillows;Position options;Education;LC Services brochure  Discharge    Consult Status Consult Status: Follow-up Date: 01/25/22 Follow-up type: In-patient    [name] 01/24/2022, 9:00 PM

## 2022-01-24 NOTE — Progress Notes (Signed)
Patient ID: Stacie [name], female   DOB: [birth year], 26 y.o.   MRN: [id number]  S: feeling pain with contractions  O:  Vitals:   01/24/22 0832 01/24/22 0900 01/24/22 0930 01/24/22 0936  BP: 139/82 (!) 143/75 137/81   Pulse: 64 64 68   Resp: 18 18 18    Temp:    97.9 F (36.6 C)  TempSrc:    Oral  SpO2:      Weight:      Height:       AOx3, NAD  Cvx 5-6/C/-2 Toco Q2-4 FHR 130 reactive, cat 1 tracing  AROM clear fluid Epidural on request FWB reassuring

## 2022-01-24 NOTE — Anesthesia Postprocedure Evaluation (Signed)
Anesthesia Post Note  Patient: Stacie [name]  Procedure(s) Performed: AN AD HOC LABOR EPIDURAL     Patient location during evaluation: Mother Baby Anesthesia Type: Epidural Level of consciousness: awake and alert Pain management: pain level controlled Vital Signs Assessment: post-procedure vital signs reviewed and stable Respiratory status: spontaneous breathing, nonlabored ventilation and respiratory function stable Cardiovascular status: stable Postop Assessment: no headache, no backache and epidural receding Anesthetic complications: no   No notable events documented.  Last Vitals:  Vitals:   01/24/22 1805 01/24/22 1825  BP: 139/69 (!) 125/59  Pulse: 73 68  Resp: 16 16  Temp: 36.9 C   SpO2: 99% 99%    Last Pain:  Vitals:   01/24/22 1915  TempSrc:   PainSc: 0-No pain   Pain Goal:                   [employer]

## 2022-01-24 NOTE — Anesthesia Preprocedure Evaluation (Signed)
Anesthesia Evaluation  Patient identified by MRN, date of birth, ID band Patient awake    Reviewed: Allergy & Precautions, H&P , Patient's Chart, lab work & pertinent test results  Airway Mallampati: I       Dental no notable dental hx.    Pulmonary neg pulmonary ROS,    Pulmonary exam normal        Cardiovascular negative cardio ROS Normal cardiovascular exam     Neuro/Psych negative neurological ROS     GI/Hepatic negative GI ROS, Neg liver ROS,   Endo/Other  negative endocrine ROS  Renal/GU negative Renal ROS  negative genitourinary   Musculoskeletal   Abdominal (+) + obese,   Peds  Hematology negative hematology ROS (+)   Anesthesia Other Findings   Reproductive/Obstetrics (+) Pregnancy                             Anesthesia Physical Anesthesia Plan  ASA: 2  Anesthesia Plan: Epidural   Post-op Pain Management:    Induction:   PONV Risk Score and Plan:   Airway Management Planned:   Additional Equipment:   Intra-op Plan:   Post-operative Plan:   Informed Consent: I have reviewed the patients History and Physical, chart, labs and discussed the procedure including the risks, benefits and alternatives for the proposed anesthesia with the patient or authorized representative who has indicated his/her understanding and acceptance.       Plan Discussed with:   Anesthesia Plan Comments:         Anesthesia Quick Evaluation

## 2022-01-24 NOTE — Anesthesia Procedure Notes (Signed)
Epidural Patient location during procedure: OB Start time: 01/24/2022 10:10 AM End time: 01/24/2022 10:14 AM  Staffing Anesthesiologist: Leilani Able, MD Performed: anesthesiologist   Preanesthetic Checklist Completed: patient identified, IV checked, site marked, risks and benefits discussed, surgical consent, monitors and equipment checked, pre-op evaluation and timeout performed  Epidural Patient position: sitting Prep: DuraPrep and site prepped and draped Patient monitoring: continuous pulse ox and blood pressure Approach: midline Location: L3-L4 Injection technique: LOR air  Needle:  Needle type: Tuohy  Needle gauge: 17 G Needle length: 9 cm and 9 Needle insertion depth: 7 cm Catheter type: closed end flexible Catheter size: 19 Gauge Catheter at skin depth: 12 cm Test dose: negative and Other  Assessment Events: blood not aspirated, injection not painful, no injection resistance, no paresthesia and negative IV test  Additional Notes Reason for block:procedure for pain

## 2022-01-25 ENCOUNTER — Encounter (HOSPITAL_COMMUNITY): Payer: Self-pay | Admitting: Obstetrics and Gynecology

## 2022-01-25 LAB — CBC
HCT: 27.7 % — ABNORMAL LOW (ref 36.0–46.0)
Hemoglobin: 8.8 g/dL — ABNORMAL LOW (ref 12.0–15.0)
MCH: 24.8 pg — ABNORMAL LOW (ref 26.0–34.0)
MCHC: 31.8 g/dL (ref 30.0–36.0)
MCV: 78 fL — ABNORMAL LOW (ref 80.0–100.0)
Platelets: 171 10*3/uL (ref 150–400)
RBC: 3.55 MIL/uL — ABNORMAL LOW (ref 3.87–5.11)
RDW: 15.4 % (ref 11.5–15.5)
WBC: 11.4 10*3/uL — ABNORMAL HIGH (ref 4.0–10.5)
nRBC: 0 % (ref 0.0–0.2)

## 2022-01-25 MED ORDER — IBUPROFEN 600 MG PO TABS: 600.0000 mg | ORAL_TABLET | Freq: Four times a day (QID) | ORAL | 0 refills | Status: AC

## 2022-01-25 MED ORDER — ACETAMINOPHEN 325 MG PO TABS: 650.0000 mg | ORAL_TABLET | ORAL | 1 refills | Status: AC | PRN

## 2022-01-25 NOTE — Clinical Social Work Maternal (Signed)
CLINICAL SOCIAL WORK MATERNAL/CHILD NOTE  Patient Details  Name: Stacie [name] MRN: [id number] Date of Birth: 06-13-1996  Date:  01/25/2022  Clinical Social Worker Initiating Note:  Darra Lis, [place] Date/Time: Initiated:  01/25/22/1045     Child's Name:  Stacie [name]   Biological Parents:  Mother, Father Kathryne Hitch)   Need for Interpreter:  None   Reason for Referral:  Behavioral Health Concerns, Current Substance Use/Substance Use During Pregnancy     Address:  [address] [place] [address]    Phone number:  [phone] (home)     Additional phone number:   Household Members/Support Persons (HM/SP):   Household Member/Support Person 1, Household Member/Support Person 2, Household Member/Support Person 3   HM/SP Name Relationship DOB or Age  HM/SP -1 Dylan Oakes Significant Other    HM/SP -[place] Mother 78  HM/SP -[place] Stepfather Unknown  HM/SP -4        HM/SP -5        HM/SP -6        HM/SP -7        HM/SP -8          Natural Supports (not living in the home):  Immediate Family   Professional Supports: None   Employment: Full-time   Type of Work: Scientist, forensic   Education:  [place] arranged:    Museum/gallery curator Resources:  Multimedia programmer    Other Resources:  Physicist, medical   (Provided information on [employer])   Cultural/Religious Considerations Which May Impact Care:    Strengths:  Ability to meet basic needs  , Home prepared for child  , Pediatrician chosen   Psychotropic Medications:         Pediatrician:    [employer]  Pediatrician List:   [place]      Pediatrician Fax Number:    Risk Factors/Current Problems:  None   Cognitive State:  Alert  , Insightful  , Linear Thinking     Mood/Affect:  Happy  , Interested     CSW Assessment: CSW consulted for anxiety,  depression and THC. CSW met with MOB assess and offer support. CSW introduced self and role. CSW observed FOB present, holding baby 'Tillie'. CSW offered to speak with MOB in private, however MOB declined and stated FOB could remain in the room. CSW informed MOB of the reason for consult and assessed current mood. MOB was engaged and pleasant as she expressed she is doing well. MOB shared the pregnancy was physically rough. CSW inquired on MOB mental health history. MOB stated she was diagnosed with anxiety and depression when she was 15. MOB reported she mostly experienced anxiety during the pregnancy. MOB shared she tried Lexapro and Hydroxyzine during the pregnancy, however it made her sick. CSW asked MOB how she copes, MOB stated she talks to FOB who provides reasoning. MOB reported she has tried therapy in the past but she did not like it. MOB was receptive to resources and agreed to notify a professional if mental health needs arise. MOB identified FOB and her parents as supports. MOB denies any current SI or HI.   CSW inquired on substance use. MOB reported she smoked THC during the pregnancy to help with nausea. MOB stated she did not smoke a lot and last smoked once in  December. MOB denies any additional substance use. CSW informed MOB of the hospital drug screen policy. MOB aware infant UDS is negative and the CDS will be followed. CSW informed MOB that a CPS report will be made if infant test positive for substances. MOB denied any questions.   CSW provided education regarding the baby blues period versus PPD. CSW provided the New Mom Checklist and encouraged MOB to self evaluate.  CSW provided review of Sudden Infant Death Syndrome (SIDS) precautions. MOB reported she has a crib and all infant essentials. MOB denies any barriers to infant follow-up care. MOB reported no additional concerns at this time.  CSW will continue to follow CDS and make a CPS report if warranted. CSW identifies no further  need for intervention and no barriers to discharge at this time.  CSW Plan/Description:  No Further Intervention Required/No Barriers to Discharge, CSW Will Continue to Monitor Umbilical Cord Tissue Drug Screen Results and Make Report if Warranted, Child Protective Service Report  , [place], Perinatal Mood and Anxiety Disorder (PMADs) Education, Sudden Infant Death Syndrome (SIDS) Education, Other Information/Referral to [employer], Other Patient/Family Education    [name], [place] 01/25/2022, 11:17 AM

## 2022-01-25 NOTE — Lactation Note (Signed)
This note was copied from a baby's chart. Lactation Consultation Note  Patient Name: [name] [id number] Date: 01/25/2022 Reason for consult: Follow-up assessment;1st time breastfeeding;Primapara;Early term 37-38.6wks;Infant weight loss;Other (Comment) (3 % weight loss, For D/C today . LC reviewed and updated the doc flow sheets per mom and dad. LC reviewed BF D/C teaching. per mom breast feeding is going well and baby is breast feeding both breast.  mom aware of her [place] resources after D/C.) Age:54 hours  Maternal Data    Feeding Mother's Current Feeding Choice: Breast Milk  LATCH Score   Lactation Tools Discussed/Used    Interventions    Discharge Discharge Education: Engorgement and breast care;Warning signs for feeding baby Pump: Personal;Manual;DEBP  Consult Status Consult Status: Complete Date: 01/25/22    Myer Haff 01/25/2022, 4:20 PM

## 2022-01-25 NOTE — Progress Notes (Addendum)
Post Partum Day 1 Subjective: no complaints, up ad lib, voiding, and tolerating PO.  States baby latching well and would like early d/c today  Objective: Blood pressure 132/77, pulse 73, temperature 97.9 F (36.6 C), temperature source Oral, resp. rate 18, height 5\' 10"  (1.778 m), weight 115 kg, SpO2 100 %, unknown if currently breastfeeding.  Physical Exam:  General: alert and cooperative Lochia: appropriate Uterine Fundus: firm   Recent Labs    01/24/22 1553 01/25/22 0531  HGB 9.6* 8.8*  HCT 30.5* 27.7*    Assessment/Plan: Discharge home if baby able to go All BP have been normal since delivery   LOS: 2 days   01/27/22 01/25/2022, 11:06 AM

## 2022-01-25 NOTE — Discharge Summary (Signed)
Postpartum Discharge Summary       Patient Name: Stacie [name] DOB: Jun 20, 1996 MRN: [id number]  Date of admission: 01/23/2022 Delivery date:01/24/2022  Delivering provider: Waynard Reeds  Date of discharge: 01/25/2022  Admitting diagnosis: Indication for care in labor or delivery [O75.9] Spontaneous vaginal delivery [O80] Intrauterine pregnancy: [redacted]w[redacted]d     Secondary diagnosis:  Principal Problem:   Indication for care in labor or delivery Active Problems:   Spontaneous vaginal delivery  Additional problems: gestational HTN    Discharge diagnosis: Term Pregnancy Delivered and Gestational Hypertension                                              Post partum procedures: none Augmentation: AROM and Pitocin Complications: None  Hospital course: Induction of Labor With Vaginal Delivery   26 y.o. yo G1P1001 at [redacted]w[redacted]d was admitted to the hospital 01/23/2022 for induction of labor.  Indication for induction: Gestational hypertension.  Patient had an uncomplicated labor course as follows: Membrane Rupture Time/Date: 9:36 AM ,01/24/2022   Delivery Method:Vaginal, Spontaneous  Episiotomy: None  Lacerations:  2nd degree  Details of delivery can be found in separate delivery note.  Patient had a routine postpartum course. Patient is discharged home 01/25/22.  Newborn Data: Birth date:01/24/2022  Birth time:2:41 PM  Gender:Female  Living status:Living  Apgars:8 ,9  Weight:3720 g     Physical exam  Vitals:   01/24/22 1825 01/24/22 2110 01/25/22 0100 01/25/22 0534  BP: (!) 125/59 134/83 134/81 132/77  Pulse: 68 80 70 73  Resp: 16 18 18 18   Temp:  98.5 F (36.9 C) 98.5 F (36.9 C) 97.9 F (36.6 C)  TempSrc:  Oral Oral Oral  SpO2: 99% 100% 100%   Weight:      Height:       General: alert and cooperative Lochia: appropriate Uterine Fundus: firm  Labs: Lab Results  Component Value Date   WBC 11.4 (H) 01/25/2022   HGB 8.8 (L) 01/25/2022   HCT 27.7 (L) 01/25/2022   MCV  78.0 (L) 01/25/2022   PLT 171 01/25/2022   CMP Latest Ref Rng & Units 01/23/2022  Glucose 70 - 99 mg/dL 86  BUN 6 - 20 mg/dL 10  Creatinine 01/25/2022 - 3.71 mg/dL 0.62  Sodium 6.94 - 854 mmol/L 137  Potassium 3.5 - 5.1 mmol/L 3.6  Chloride 98 - 111 mmol/L 107  CO2 22 - 32 mmol/L 20(L)  Calcium 8.9 - 10.3 mg/dL 9.2  Total Protein 6.5 - 8.1 g/dL 6.5  Total Bilirubin 0.3 - 1.2 mg/dL 627)  Alkaline Phos 38 - 126 U/L 135(H)  AST 15 - 41 U/L 18  ALT 0 - 44 U/L 14   Edinburgh Score: Edinburgh Postnatal Depression Scale Screening Tool 01/24/2022  I have been able to laugh and see the funny side of things. 0  I have looked forward with enjoyment to things. 0  I have blamed myself unnecessarily when things went wrong. 1  I have been anxious or worried for no good reason. 3  I have felt scared or panicky for no good reason. 2  Things have been getting on top of me. 1  I have been so unhappy that I have had difficulty sleeping. 0  I have felt sad or miserable. 1  I have been so unhappy that I have been crying. 1  The  thought of harming myself has occurred to me. 0  Edinburgh Postnatal Depression Scale Total 9     After visit meds:  Allergies as of 01/25/2022       Reactions   Latex Rash        Medication List     STOP taking these medications    metoCLOPramide 10 MG tablet Commonly known as: Reglan       TAKE these medications    acetaminophen 325 MG tablet Commonly known as: Tylenol Take 2 tablets (650 mg total) by mouth every 4 (four) hours as needed (for pain scale < 4).   ibuprofen 600 MG tablet Commonly known as: ADVIL Take 1 tablet (600 mg total) by mouth every 6 (six) hours.   prenatal multivitamin Tabs tablet Take 1 tablet by mouth daily at 12 noon.         Discharge home in stable condition Infant Feeding: Breast Infant Disposition:home with mother Discharge instruction: per After Visit Summary and Postpartum booklet. Activity: Advance as tolerated.  Pelvic rest for 6 weeks.  Diet: routine diet Future Appointments:No future appointments. Follow up Visit:  Follow-up Information     Shivaji, Valerie Roys, MD. Schedule an appointment as soon as possible for a visit in 6 week(s).   Specialty: Obstetrics and Gynecology Why: Routine pp Contact information: [address] [place] [place] [address] [phone]                  Please schedule this patient for a In person postpartum visit in 6 weeks with the following provider: MD.  Delivery mode:  Vaginal, Spontaneous  Anticipated Birth Control:  Unsure   01/25/2022 Oliver Pila, MD

## 2022-02-03 ENCOUNTER — Telehealth (HOSPITAL_COMMUNITY): Payer: Self-pay | Admitting: *Deleted

## 2022-02-03 NOTE — Telephone Encounter (Signed)
Phone voicemail message left to return nurse call. ? ?Duffy Rhody, RN 02-03-2022 at 10:19am ?

## 2022-02-13 ENCOUNTER — Other Ambulatory Visit: Payer: Self-pay

## 2022-02-13 ENCOUNTER — Inpatient Hospital Stay (HOSPITAL_COMMUNITY)
Admission: AD | Admit: 2022-02-13 | Discharge: 2022-02-15 | DRG: 776 | Disposition: A | Discharge status: Home / Self Care | Payer: Medicaid Other | Attending: Obstetrics and Gynecology | Admitting: Obstetrics and Gynecology

## 2022-02-13 ENCOUNTER — Encounter (HOSPITAL_COMMUNITY): Payer: Self-pay | Admitting: Obstetrics and Gynecology

## 2022-02-13 DIAGNOSIS — O8619 Other infection of genital tract following delivery: Principal | ICD-10-CM | POA: Diagnosis present

## 2022-02-13 DIAGNOSIS — O26899 Other specified pregnancy related conditions, unspecified trimester: Secondary | ICD-10-CM | POA: Diagnosis present

## 2022-02-13 DIAGNOSIS — Z20822 Contact with and (suspected) exposure to covid-19: Secondary | ICD-10-CM | POA: Diagnosis present

## 2022-02-13 DIAGNOSIS — L0291 Cutaneous abscess, unspecified: Secondary | ICD-10-CM

## 2022-02-13 DIAGNOSIS — R188 Other ascites: Secondary | ICD-10-CM

## 2022-02-13 DIAGNOSIS — R109 Unspecified abdominal pain: Secondary | ICD-10-CM

## 2022-02-13 MED ORDER — LACTATED RINGERS IV SOLN: INTRAVENOUS | Status: DC

## 2022-02-13 NOTE — MAU Provider Note (Signed)
Patient Stacie [name] is a 26 y.o. G1P1001 ? At 2 weeks postpartum with complaints of worsening abdominal pain that is a 7/10. She denies nausea, vomiting, weakness but states she had a fever at home of 102. She denies heavy vaginal bleeding. She was seen in [facility] ED and diagnosed with UTI and ascites on 02/09/2022. She was given Rocephin at [facility] ED and discharged home with macrobid for UTI. She was then seen on 02/10/2022 at [facility] and diagnosed with a a "hematoma behind her uterus" by [place] at that office. SHe was told to come to MAU if the pain worsens. It started worsening today.  ? ?She denies chest pain, she endorses pain when she breathes, coughs, laughs and having a BM. She denies constipation or diarrhea.  ? ?Patient had temp of 102 at home at 2130; reports that she has had fever "off and on" for the past few days. She reports overall feeling unwell.  ? ?History  ?  ? ?CSN: [id number] ? ?Arrival date and time: 02/13/22 2219 ? ? Event Date/Time  ? First Provider Initiated Contact with Patient 02/13/22 2309   ?  ? ?Chief Complaint  ?Patient presents with  ? Fever  ? Vaginal Bleeding  ? Abdominal Pain  ? ?Fever  ?This is a new problem. The current episode started in the past 7 days. The maximum temperature noted was 102 to 102.9 F. Associated symptoms include abdominal pain. She has tried acetaminophen for the symptoms. The treatment provided significant relief.  ?Vaginal Bleeding ?The patient's primary symptoms include vaginal bleeding. This is a recurrent problem. Associated symptoms include abdominal pain and a fever.  ?Abdominal Pain ?This is a new problem. The current episode started in the past 7 days. The onset quality is sudden. The problem occurs constantly. The problem has been gradually worsening. The pain is located in the LLQ, LUQ, RLQ and RUQ. The pain is at a severity of 7/10. Associated symptoms include a fever.  ? ?OB History   ? ? Gravida  ?1  ? Para  ?1  ? Term  ?1  ? Preterm  ?   ? AB  ?    ? Living  ?1  ?  ? ? SAB  ?   ? IAB  ?   ? Ectopic  ?   ? Multiple  ?0  ? Live Births  ?1  ?   ?  ?  ? ? ?Past Medical History:  ?Diagnosis Date  ? Complication of anesthesia   ? Depression   ? GAD (generalized anxiety disorder)   ? Left wrist fracture   ? ? ?Past Surgical History:  ?Procedure Laterality Date  ? CLOSED REDUCTION WRIST FRACTURE Right 08/25/2013  ? Procedure: CLOSED REDUCTION WRIST;  Surgeon: Dominica Severin, MD;  Location: [facility] OR;  Service: Orthopedics;  Laterality: Right;  ? KNEE SURGERY Right 03/05/2012  ? ? ?History reviewed. No pertinent family history. ? ?Social History  ? ?Tobacco Use  ? Smoking status: Never  ? Smokeless tobacco: Never  ?Vaping Use  ? Vaping Use: Some days  ?Substance Use Topics  ? Alcohol use: No  ? Drug use: Not Currently  ?  Types: Marijuana  ? ? ?Allergies:  ?Allergies  ?Allergen Reactions  ? Latex Rash  ? ? ?Medications Prior to Admission  ?Medication Sig Dispense Refill Last Dose  ? acetaminophen (TYLENOL) 325 MG tablet Take 2 tablets (650 mg total) by mouth every 4 (four) hours as needed (for pain scale <  4). 30 tablet 1 02/13/2022  ? ibuprofen (ADVIL) 600 MG tablet Take 1 tablet (600 mg total) by mouth every 6 (six) hours. 30 tablet 0 02/13/2022  ? oxycodone (OXY-IR) 5 MG capsule Take 5 mg by mouth every 4 (four) hours as needed.   02/13/2022  ? Prenatal Vit-Fe Fumarate-FA (PRENATAL MULTIVITAMIN) TABS tablet Take 1 tablet by mouth daily at 12 noon.     ? ? ?Review of Systems  ?Constitutional:  Positive for fever.  ?HENT: Negative.    ?Respiratory: Negative.    ?Cardiovascular: Negative.   ?Gastrointestinal:  Positive for abdominal pain.  ?Genitourinary:  Positive for vaginal bleeding.  ?Musculoskeletal: Negative.   ?Psychiatric/Behavioral: Negative.    ?Physical Exam  ? ?Blood pressure 138/71, pulse 86, temperature 98.5 ?F (36.9 ?C), temperature source Oral, resp. rate 18, height 5\' 10"  (1.778 m), weight 109.9 kg, SpO2 100 %, unknown if currently breastfeeding. ? ?Physical  Exam ?Constitutional:   ?   Appearance: She is well-developed.  ?HENT:  ?   Head: Normocephalic.  ?Abdominal:  ?   General: Abdomen is flat.  ?   Palpations: Abdomen is soft.  ?   Tenderness: There is abdominal tenderness in the right upper quadrant, right lower quadrant, left upper quadrant and left lower quadrant.  ?Genitourinary: ?   Vagina: No vaginal discharge, tenderness or bleeding.  ?   Cervix: Discharge present.  ?Neurological:  ?   Mental Status: She is alert.  ? ? ?MAU Course  ?Procedures ? ?MDM ?-on speculum exam, bleeding is normal for 2 weeks postpartum and cervix is non-tender. She is slightly tender over suprapubic area.  ?-will draw labs, start IV and send full .  ?-Dr. [place] made aware of patient's presence on the unit and will look at her office records. Dr. Mindi Slicker to review patient's visit on 3/9 at her office.  ? ?-records reviewed with [facility] visit, mild ascites was noted on exam.  ? ?-5/9 here shows possible abscess in right fallopian tube; discussed results with Dr. [place] and then with Dr. Mindi Slicker. Will plan to admit for IV antibiotics. Patient VSS and she is afebrile at this point.  ?-UA shows only blood and large leuks.  ? ?Assessment and Plan  ? ?Postpartum fallopian tube asbcess ?Admit to [facility] speciality care for IV antibiotics, possible IR consult ? ? ? ?[facility] ?02/14/2022, 3:29 AM  ?

## 2022-02-13 NOTE — MAU Note (Signed)
Stacie [name] is a 26 y.o. at Unknown here in MAU reporting: Saw OB on Thursday and has hematoma on uterus. Had fever of 102 and vaginal bleeding. Took tylenol at 2130. Having abdominal pain mid and lower.  ?LMP: PP vag delivery ?Onset of complaint: 02/13/22 ?Pain score: 8/10 ?Vitals:  ? 02/13/22 2232  ?BP: 120/73  ?Pulse: (!) 103  ?Resp: 18  ?Temp: 99.3 ?F (37.4 ?C)  ?SpO2: 98%  ?   ?Lab orders placed from triage: Urinalysis ? ?

## 2022-02-14 ENCOUNTER — Inpatient Hospital Stay (HOSPITAL_COMMUNITY): Payer: Medicaid Other

## 2022-02-14 DIAGNOSIS — O8619 Other infection of genital tract following delivery: Principal | ICD-10-CM | POA: Diagnosis present

## 2022-02-14 DIAGNOSIS — K6289 Other specified diseases of anus and rectum: Secondary | ICD-10-CM | POA: Diagnosis present

## 2022-02-14 DIAGNOSIS — Z20822 Contact with and (suspected) exposure to covid-19: Secondary | ICD-10-CM | POA: Diagnosis present

## 2022-02-14 DIAGNOSIS — O26899 Other specified pregnancy related conditions, unspecified trimester: Secondary | ICD-10-CM | POA: Diagnosis present

## 2022-02-14 LAB — CBC WITH DIFFERENTIAL/PLATELET
Abs Immature Granulocytes: 0.03 10*3/uL (ref 0.00–0.07)
Basophils Absolute: 0 10*3/uL (ref 0.0–0.1)
Basophils Relative: 0 %
Eosinophils Absolute: 0.1 10*3/uL (ref 0.0–0.5)
Eosinophils Relative: 1 %
HCT: 26.2 % — ABNORMAL LOW (ref 36.0–46.0)
Hemoglobin: 7.9 g/dL — ABNORMAL LOW (ref 12.0–15.0)
Immature Granulocytes: 0 %
Lymphocytes Relative: 20 %
Lymphs Abs: 1.7 10*3/uL (ref 0.7–4.0)
MCH: 24.2 pg — ABNORMAL LOW (ref 26.0–34.0)
MCHC: 30.2 g/dL (ref 30.0–36.0)
MCV: 80.1 fL (ref 80.0–100.0)
Monocytes Absolute: 0.6 10*3/uL (ref 0.1–1.0)
Monocytes Relative: 7 %
Neutro Abs: 5.8 10*3/uL (ref 1.7–7.7)
Neutrophils Relative %: 72 %
Platelets: 271 10*3/uL (ref 150–400)
RBC: 3.27 MIL/uL — ABNORMAL LOW (ref 3.87–5.11)
RDW: 17.4 % — ABNORMAL HIGH (ref 11.5–15.5)
WBC: 8.2 10*3/uL (ref 4.0–10.5)
nRBC: 0 % (ref 0.0–0.2)

## 2022-02-14 LAB — RESP PANEL BY RT-PCR (FLU A&B, COVID) ARPGX2
Influenza A by PCR: NEGATIVE
Influenza B by PCR: NEGATIVE
SARS Coronavirus 2 by RT PCR: NEGATIVE

## 2022-02-14 LAB — CBC
HCT: 26.6 % — ABNORMAL LOW (ref 36.0–46.0)
Hemoglobin: 8.2 g/dL — ABNORMAL LOW (ref 12.0–15.0)
MCH: 24.3 pg — ABNORMAL LOW (ref 26.0–34.0)
MCHC: 30.8 g/dL (ref 30.0–36.0)
MCV: 78.7 fL — ABNORMAL LOW (ref 80.0–100.0)
Platelets: 279 10*3/uL (ref 150–400)
RBC: 3.38 MIL/uL — ABNORMAL LOW (ref 3.87–5.11)
RDW: 17.2 % — ABNORMAL HIGH (ref 11.5–15.5)
WBC: 9.7 10*3/uL (ref 4.0–10.5)
nRBC: 0 % (ref 0.0–0.2)

## 2022-02-14 LAB — COMPREHENSIVE METABOLIC PANEL
ALT: 74 U/L — ABNORMAL HIGH (ref 0–44)
AST: 44 U/L — ABNORMAL HIGH (ref 15–41)
Albumin: 2.5 g/dL — ABNORMAL LOW (ref 3.5–5.0)
Alkaline Phosphatase: 128 U/L — ABNORMAL HIGH (ref 38–126)
Anion gap: 7 (ref 5–15)
BUN: 8 mg/dL (ref 6–20)
CO2: 26 mmol/L (ref 22–32)
Calcium: 8.5 mg/dL — ABNORMAL LOW (ref 8.9–10.3)
Chloride: 107 mmol/L (ref 98–111)
Creatinine, Ser: 0.7 mg/dL (ref 0.44–1.00)
GFR, Estimated: >60 mL/min (ref >60)
Glucose, Bld: 107 mg/dL — ABNORMAL HIGH (ref 70–99)
Potassium: 3.8 mmol/L (ref 3.5–5.1)
Sodium: 140 mmol/L (ref 135–145)
Total Bilirubin: 0.3 mg/dL (ref 0.3–1.2)
Total Protein: 5.8 g/dL — ABNORMAL LOW (ref 6.5–8.1)

## 2022-02-14 LAB — URINALYSIS, COMPLETE (UACMP) WITH MICROSCOPIC
Bilirubin Urine: NEGATIVE
Glucose, UA: NEGATIVE mg/dL
Ketones, ur: NEGATIVE mg/dL
Nitrite: NEGATIVE
Protein, ur: NEGATIVE mg/dL
Specific Gravity, Urine: 1.009 (ref 1.005–1.030)
WBC, UA: >50 WBC/hpf — ABNORMAL HIGH (ref 0–5)
pH: 5 (ref 5.0–8.0)

## 2022-02-14 LAB — TYPE AND SCREEN
ABO/RH(D): A POS
Antibody Screen: NEGATIVE

## 2022-02-14 MED ORDER — LACTATED RINGERS IV SOLN: INTRAVENOUS | Status: DC

## 2022-02-14 MED ORDER — HYDROMORPHONE HCL 1 MG/ML IJ SOLN
1.0000 mg | Freq: Once | INTRAMUSCULAR | Status: AC
  Administered 2022-02-14: 1 mg via INTRAVENOUS
  Filled 2022-02-14: qty 1

## 2022-02-14 MED ORDER — ZOLPIDEM TARTRATE 5 MG PO TABS
5.0000 mg | ORAL_TABLET | Freq: Every evening | ORAL | Status: DC | PRN
  Administered 2022-02-14: 5 mg via ORAL
  Filled 2022-02-14: qty 1

## 2022-02-14 MED ORDER — IOHEXOL 300 MG/ML  SOLN
100.0000 mL | Freq: Once | INTRAMUSCULAR | Status: AC | PRN
  Administered 2022-02-14: 100 mL via INTRAVENOUS

## 2022-02-14 MED ORDER — IBUPROFEN 600 MG PO TABS: 600.0000 mg | ORAL_TABLET | Freq: Four times a day (QID) | ORAL | Status: DC | PRN

## 2022-02-14 MED ORDER — HYDROMORPHONE HCL 1 MG/ML IJ SOLN
1.0000 mg | INTRAMUSCULAR | Status: DC | PRN
  Administered 2022-02-15: 1 mg via INTRAVENOUS
  Filled 2022-02-14: qty 1

## 2022-02-14 MED ORDER — SODIUM CHLORIDE 0.9 % IV SOLN
3.0000 g | Freq: Four times a day (QID) | INTRAVENOUS | Status: DC
  Administered 2022-02-14 – 2022-02-15 (×7): 3 g via INTRAVENOUS
  Filled 2022-02-14 (×8): qty 8

## 2022-02-14 MED ORDER — IOHEXOL 9 MG/ML PO SOLN
500.0000 mL | ORAL | Status: AC
  Administered 2022-02-14: 500 mL via ORAL

## 2022-02-14 MED ORDER — PRENATAL MULTIVITAMIN CH
1.0000 | ORAL_TABLET | Freq: Every day | ORAL | Status: DC
  Administered 2022-02-15: 1 via ORAL
  Filled 2022-02-14: qty 1

## 2022-02-14 MED ORDER — OXYCODONE-ACETAMINOPHEN 5-325 MG PO TABS
1.0000 | ORAL_TABLET | Freq: Four times a day (QID) | ORAL | Status: DC | PRN
  Administered 2022-02-14 – 2022-02-15 (×2): 2 via ORAL
  Filled 2022-02-14 (×2): qty 2

## 2022-02-14 MED ORDER — IBUPROFEN 600 MG PO TABS
600.0000 mg | ORAL_TABLET | Freq: Four times a day (QID) | ORAL | Status: DC
  Administered 2022-02-14: 600 mg via ORAL
  Filled 2022-02-14: qty 3
  Filled 2022-02-14: qty 1
  Filled 2022-02-14: qty 3

## 2022-02-14 MED ORDER — DOCUSATE SODIUM 100 MG PO CAPS: 100.0000 mg | ORAL_CAPSULE | Freq: Every day | ORAL | Status: DC

## 2022-02-14 MED ORDER — ACETAMINOPHEN 325 MG PO TABS
650.0000 mg | ORAL_TABLET | ORAL | Status: DC | PRN
  Administered 2022-02-14: 650 mg via ORAL
  Filled 2022-02-14: qty 2

## 2022-02-14 MED ORDER — CALCIUM CARBONATE ANTACID 500 MG PO CHEW: 2.0000 | CHEWABLE_TABLET | ORAL | Status: DC | PRN

## 2022-02-14 MED ORDER — IOHEXOL 9 MG/ML PO SOLN
ORAL | Status: AC
  Administered 2022-02-14: 500 mL
  Filled 2022-02-14: qty 1000

## 2022-02-14 MED ORDER — KETOROLAC TROMETHAMINE 30 MG/ML IJ SOLN
30.0000 mg | Freq: Four times a day (QID) | INTRAMUSCULAR | Status: DC | PRN
  Administered 2022-02-14 (×2): 30 mg via INTRAVENOUS
  Filled 2022-02-14 (×2): qty 1

## 2022-02-14 NOTE — Progress Notes (Signed)
Patient ID: Stacie [name], female   DOB: 12-12-1995, 26 y.o.   MRN: [id number] ? ?S: Pain present, sometimes minimal sometimes severe. Overall, better than on admission. Finishing PO contrast for CT scan ?O: AFVSS ?AOX3, NAD ?Normal work of breathing ?Abd soft, tender ?Results for orders placed or performed during the hospital encounter of 02/13/22 (from the past 24 hour(s))  ?CBC     Status: Abnormal  ? Collection Time: 02/14/22 12:06 AM  ?Result Value Ref Range  ? WBC 9.7 4.0 - 10.5 K/uL  ? RBC 3.38 (L) 3.87 - 5.11 MIL/uL  ? Hemoglobin 8.2 (L) 12.0 - 15.0 g/dL  ? HCT 26.6 (L) 36.0 - 46.0 %  ? MCV 78.7 (L) 80.0 - 100.0 fL  ? MCH 24.3 (L) 26.0 - 34.0 pg  ? MCHC 30.8 30.0 - 36.0 g/dL  ? RDW 17.2 (H) 11.5 - 15.5 %  ? Platelets 279 150 - 400 K/uL  ? nRBC 0.0 0.0 - 0.2 %  ?Comprehensive metabolic panel     Status: Abnormal  ? Collection Time: 02/14/22 12:06 AM  ?Result Value Ref Range  ? Sodium 140 135 - 145 mmol/L  ? Potassium 3.8 3.5 - 5.1 mmol/L  ? Chloride 107 98 - 111 mmol/L  ? CO2 26 22 - 32 mmol/L  ? Glucose, Bld 107 (H) 70 - 99 mg/dL  ? BUN 8 6 - 20 mg/dL  ? Creatinine, Ser 0.70 0.44 - 1.00 mg/dL  ? Calcium 8.5 (L) 8.9 - 10.3 mg/dL  ? Total Protein 5.8 (L) 6.5 - 8.1 g/dL  ? Albumin 2.5 (L) 3.5 - 5.0 g/dL  ? AST 44 (H) 15 - 41 U/L  ? ALT 74 (H) 0 - 44 U/L  ? Alkaline Phosphatase 128 (H) 38 - 126 U/L  ? Total Bilirubin 0.3 0.3 - 1.2 mg/dL  ? GFR, Estimated >60 >60 mL/min  ? Anion gap 7 5 - 15  ?Type and screen [facility]     Status: None  ? Collection Time: 02/14/22 12:06 AM  ?Result Value Ref Range  ? ABO/RH(D) A POS   ? Antibody Screen NEG   ? Sample Expiration    ?  02/17/2022,2359 ?Performed at [facility], [address], [place], [place] [address] ?  ?Urinalysis, Complete w Microscopic Urine, Clean Catch     Status: Abnormal  ? Collection Time: 02/14/22  1:19 AM  ?Result Value Ref Range  ? Color, Urine YELLOW YELLOW  ? APPearance HAZY (A) CLEAR  ? Specific Gravity, Urine 1.009  1.005 - 1.030  ? pH 5.0 5.0 - 8.0  ? Glucose, UA NEGATIVE NEGATIVE mg/dL  ? Hgb urine dipstick LARGE (A) NEGATIVE  ? Bilirubin Urine NEGATIVE NEGATIVE  ? Ketones, ur NEGATIVE NEGATIVE mg/dL  ? Protein, ur NEGATIVE NEGATIVE mg/dL  ? Nitrite NEGATIVE NEGATIVE  ? Leukocytes,Ua LARGE (A) NEGATIVE  ? RBC / HPF 6-10 0 - 5 RBC/hpf  ? WBC, UA >50 (H) 0 - 5 WBC/hpf  ? Bacteria, UA FEW (A) NONE SEEN  ? Squamous Epithelial / LPF 0-5 0 - 5  ?Resp Panel by RT-PCR (Flu A&B, Covid) Nasopharyngeal Swab     Status: None  ? Collection Time: 02/14/22  2:22 AM  ? Specimen: Nasopharyngeal Swab; Nasopharyngeal(NP) swabs in vial transport medium  ?Result Value Ref Range  ? SARS Coronavirus 2 by RT PCR NEGATIVE NEGATIVE  ? Influenza A by PCR NEGATIVE NEGATIVE  ? Influenza B by PCR NEGATIVE NEGATIVE  ?CBC  with Differential     Status: Abnormal  ? Collection Time: 02/14/22  4:03 AM  ?Result Value Ref Range  ? WBC 8.2 4.0 - 10.5 K/uL  ? RBC 3.27 (L) 3.87 - 5.11 MIL/uL  ? Hemoglobin 7.9 (L) 12.0 - 15.0 g/dL  ? HCT 26.2 (L) 36.0 - 46.0 %  ? MCV 80.1 80.0 - 100.0 fL  ? MCH 24.2 (L) 26.0 - 34.0 pg  ? MCHC 30.2 30.0 - 36.0 g/dL  ? RDW 17.4 (H) 11.5 - 15.5 %  ? Platelets 271 150 - 400 K/uL  ? nRBC 0.0 0.0 - 0.2 %  ? Neutrophils Relative % 72 %  ? Neutro Abs 5.8 1.7 - 7.7 K/uL  ? Lymphocytes Relative 20 %  ? Lymphs Abs 1.7 0.7 - 4.0 K/uL  ? Monocytes Relative 7 %  ? Monocytes Absolute 0.6 0.1 - 1.0 K/uL  ? Eosinophils Relative 1 %  ? Eosinophils Absolute 0.1 0.0 - 0.5 K/uL  ? Basophils Relative 0 %  ? Basophils Absolute 0.0 0.0 - 0.1 K/uL  ? Immature Granulocytes 0 %  ? Abs Immature Granulocytes 0.03 0.00 - 0.07 K/uL  ? ?A/P 26 yo HD#2 S/P SVD 01/24/22 admitted for abdominal pain, pelvic fluid collection, abscess vs hematoma ?1) Continue unasyn ?2) IR consulted regarding possible drainage of pelvic fluid collection. Await CT abd/pelvis results ?3) NPO for CT ? ? ? ?

## 2022-02-14 NOTE — Progress Notes (Signed)
Interventional Radiology Brief Note: ? ?Patient s/p uncomplicated SVD with 2nd degree laceration repair.  She was discharged home for post-partum recovery, however has since has intermittent fevers, UTI.  Outpatient work-up including TV US concerning for cystic structure.  She is now admitted with fevers, pelvic pain/pressure with US imaging concerning for loculation fluid collection vs. Abscess.  CT Abdomen Pelvis requested by Dr. Pascal Lux and performed this afternoon.  Will review with IR MD for possible procedure.  Patient to be NPO p MN tonight.  ? ?Brynda Greathouse, MS RD PA-C ?4:59 PM ? ? ?

## 2022-02-14 NOTE — H&P (Addendum)
Stacie [name] is an 26 y.o.G1P1001 female s/p uncomplicated svd on 01/24/22 with second degree laceration that was repaired also without complications   Pt reports worsening rectal pain that begun about 5days post delivery.   Pain  and subsequent fever resulted in her going to [facility] ED on 02/08/22 where she was diagnosed with a uti after an [place] and labs were done.  Dx included ? RPOC noted on [place] along with ascites but no masses. She was treated with rocephin , macrobid and toradol ( cbc wnl: wbc 13).   Pt was then seen in the office for follow up on 02/10/22. She reported fever had improved but rectal pressure was still present; hurt with BMs. A repeat US was performed which noted a 9x2cm well encapsulated cystic structure running posterior length of uterus adjacent to the right ovary, no internal echoes, lacy recticularity within- possible hematoma vs hydrosalpinx vs ovarian pathology diagnosed.  Pt presented to MAU this am with complaint of continued intermittent fever reaching a Tmax of 102 yesterday, vaginal bleeding and mid/lower abdominal and pelvic pain. She took tylenol prior to visit and was noted to be afebrile with stable vital signs in MAU.  She denies nausea, vomiting, weakness, change in bowel habits  now or heavy bleeding. No paliative or provocative features noted to pain. Feels "unwell" Has been unable to do much with newborn baby.  TA/TV US done while in MAU : ? Loculated infected collection vs developing abcess vs pyogenic or proteinaceous content in right fallopian tube in right hemipelvis. Unremarkable ovaries and uterus   Pertinent Gynecological History: Menses: flow is light and consistent with postpartum status  Bleeding: postpartum lochia  Contraception: none and svd three weeks ago  DES exposure: denies Blood transfusions: none Sexually transmitted diseases: past history: CT 2021 Previous GYN Procedures:  n/a   Last pap: abnormal: ASCUS/+HPV  Date: 07/2021 OB  History: G1, P1001   Menstrual History: Menarche age: 53 No LMP recorded.    Past Medical History:  Diagnosis Date   Complication of anesthesia    Depression    GAD (generalized anxiety disorder)    Left wrist fracture     Past Surgical History:  Procedure Laterality Date   CLOSED REDUCTION WRIST FRACTURE Right 08/25/2013   Procedure: CLOSED REDUCTION WRIST;  Surgeon: Dominica Severin, MD;  Location: [facility] OR;  Service: Orthopedics;  Laterality: Right;   KNEE SURGERY Right 03/05/2012    History reviewed. No pertinent family history.  Social History:  reports that she has never smoked. She has never used smokeless tobacco. She reports that she does not currently use drugs after having used the following drugs: Marijuana. She reports that she does not drink alcohol.  Allergies:  Allergies  Allergen Reactions   Latex Rash    Medications Prior to Admission  Medication Sig Dispense Refill Last Dose   acetaminophen (TYLENOL) 325 MG tablet Take 2 tablets (650 mg total) by mouth every 4 (four) hours as needed (for pain scale < 4). 30 tablet 1 02/13/2022   ibuprofen (ADVIL) 600 MG tablet Take 1 tablet (600 mg total) by mouth every 6 (six) hours. 30 tablet 0 02/13/2022   oxycodone (OXY-IR) 5 MG capsule Take 5 mg by mouth every 4 (four) hours as needed.   02/13/2022   Prenatal Vit-Fe Fumarate-FA (PRENATAL MULTIVITAMIN) TABS tablet Take 1 tablet by mouth daily at 12 noon.       Review of Systems  Constitutional:  Positive for activity change, appetite change and  fever.  Eyes:  Negative for photophobia and visual disturbance.  Respiratory:  Negative for chest tightness and shortness of breath.   Cardiovascular:  Negative for chest pain, palpitations and leg swelling.  Gastrointestinal:  Positive for abdominal pain and rectal pain. Negative for anal bleeding, blood in stool, constipation, diarrhea, nausea and vomiting.  Genitourinary:  Positive for pelvic pain and vaginal pain. Negative for  flank pain and vaginal bleeding.  Musculoskeletal:  Negative for back pain and myalgias.  Neurological:  Negative for light-headedness and numbness.  Psychiatric/Behavioral:  The patient is nervous/anxious.   All other systems reviewed and are negative.  Blood pressure 126/62, pulse 90, temperature 98.4 F (36.9 C), temperature source Oral, resp. rate 18, height  (1.778 m), weight 109.9 kg, SpO2 100 %, unknown if currently breastfeeding. Physical Exam Vitals and nursing note reviewed. Exam conducted with a chaperone present.  Constitutional:      Appearance: She is well-developed and normal weight.  Pulmonary:     Effort: Pulmonary effort is normal.  Abdominal:     General: Abdomen is flat. Bowel sounds are normal.     Palpations: Abdomen is soft.     Tenderness: There is generalized abdominal tenderness and tenderness in the right lower quadrant. There is guarding. There is no rebound. Negative signs include Murphy's sign, McBurney's sign and psoas sign.     Hernia: No hernia is present.  Genitourinary:    Vagina: Normal.     Adnexa:        Right: Tenderness present.   Skin:    General: Skin is warm and dry.  Neurological:     General: No focal deficit present.     Mental Status: She is alert and oriented to person, place, and time.  Psychiatric:        Mood and Affect: Mood is anxious.        Behavior: Behavior normal.    Results for orders placed or performed during the hospital encounter of 02/13/22 (from the past 24 hour(s))  CBC     Status: Abnormal   Collection Time: 02/14/22 12:06 AM  Result Value Ref Range   WBC 9.7 4.0 - 10.5 K/uL   RBC 3.38 (L) 3.87 - 5.11 MIL/uL   Hemoglobin 8.2 (L) 12.0 - 15.0 g/dL   HCT 45.4 (L) 09.8 - 11.9 %   MCV 78.7 (L) 80.0 - 100.0 fL   MCH 24.3 (L) 26.0 - 34.0 pg   MCHC 30.8 30.0 - 36.0 g/dL   RDW 14.7 (H) 82.9 - 56.2 %   Platelets 279 150 - 400 K/uL   nRBC 0.0 0.0 - 0.2 %  Comprehensive metabolic panel     Status: Abnormal    Collection Time: 02/14/22 12:06 AM  Result Value Ref Range   Sodium 140 135 - 145 mmol/L   Potassium 3.8 3.5 - 5.1 mmol/L   Chloride 107 98 - 111 mmol/L   CO2 26 22 - 32 mmol/L   Glucose, Bld 107 (H) 70 - 99 mg/dL   BUN 8 6 - 20 mg/dL   Creatinine, Ser 1.30 0.44 - 1.00 mg/dL   Calcium 8.5 (L) 8.9 - 10.3 mg/dL   Total Protein 5.8 (L) 6.5 - 8.1 g/dL   Albumin 2.5 (L) 3.5 - 5.0 g/dL   AST 44 (H) 15 - 41 U/L   ALT 74 (H) 0 - 44 U/L   Alkaline Phosphatase 128 (H) 38 - 126 U/L   Total Bilirubin 0.3 0.3 - 1.2 mg/dL  GFR, Estimated >60 >60 mL/min   Anion gap 7 5 - 15  Type and screen [facility]     Status: None   Collection Time: 02/14/22 12:06 AM  Result Value Ref Range   ABO/RH(D) A POS    Antibody Screen NEG    Sample Expiration      02/17/2022,2359 Performed at [facility], [address] [place], [place] [address]   Urinalysis, Complete w Microscopic Urine, Clean Catch     Status: Abnormal   Collection Time: 02/14/22  1:19 AM  Result Value Ref Range   Color, Urine YELLOW YELLOW   APPearance HAZY (A) CLEAR   Specific Gravity, Urine 1.009 1.005 - 1.030   pH 5.0 5.0 - 8.0   Glucose, UA NEGATIVE NEGATIVE mg/dL   Hgb urine dipstick LARGE (A) NEGATIVE   Bilirubin Urine NEGATIVE NEGATIVE   Ketones, ur NEGATIVE NEGATIVE mg/dL   Protein, ur NEGATIVE NEGATIVE mg/dL   Nitrite NEGATIVE NEGATIVE   Leukocytes,Ua LARGE (A) NEGATIVE   RBC / HPF 6-10 0 - 5 RBC/hpf   WBC, UA >50 (H) 0 - 5 WBC/hpf   Bacteria, UA FEW (A) NONE SEEN   Squamous Epithelial / LPF 0-5 0 - 5  Resp Panel by RT-PCR (Flu A&B, Covid) Nasopharyngeal Swab     Status: None   Collection Time: 02/14/22  2:22 AM   Specimen: Nasopharyngeal Swab; Nasopharyngeal(NP) swabs in vial transport medium  Result Value Ref Range   SARS Coronavirus 2 by RT PCR NEGATIVE NEGATIVE   Influenza A by PCR NEGATIVE NEGATIVE   Influenza B by PCR NEGATIVE NEGATIVE  CBC with Differential     Status: Abnormal    Collection Time: 02/14/22  4:03 AM  Result Value Ref Range   WBC 8.2 4.0 - 10.5 K/uL   RBC 3.27 (L) 3.87 - 5.11 MIL/uL   Hemoglobin 7.9 (L) 12.0 - 15.0 g/dL   HCT 60.426.2 (L) 54.036.0 - 98.146.0 %   MCV 80.1 80.0 - 100.0 fL   MCH 24.2 (L) 26.0 - 34.0 pg   MCHC 30.2 30.0 - 36.0 g/dL   RDW 19.117.4 (H) 47.811.5 - 29.515.5 %   Platelets 271 150 - 400 K/uL   nRBC 0.0 0.0 - 0.2 %   Neutrophils Relative % 72 %   Neutro Abs 5.8 1.7 - 7.7 K/uL   Lymphocytes Relative 20 %   Lymphs Abs 1.7 0.7 - 4.0 K/uL   Monocytes Relative 7 %   Monocytes Absolute 0.6 0.1 - 1.0 K/uL   Eosinophils Relative 1 %   Eosinophils Absolute 0.1 0.0 - 0.5 K/uL   Basophils Relative 0 %   Basophils Absolute 0.0 0.0 - 0.1 K/uL   Immature Granulocytes 0 %   Abs Immature Granulocytes 0.03 0.00 - 0.07 K/uL    US ASCITES (ABDOMEN LIMITED)  Result Date: 02/14/2022 CLINICAL DATA:  Pelvic pain.  Three weeks postpartum EXAM: LIMITED ABDOMEN ULTRASOUND FOR ASCITES TECHNIQUE: Limited ultrasound survey for ascites was performed in all four abdominal quadrants. COMPARISON:  Pelvic ultrasound earlier today. FINDINGS: Trace free fluid in the right lower quadrant. Otherwise no free fluid visualized in the abdomen and pelvis. IMPRESSION: Trace free fluid in the right lower quadrant. Electronically Signed   By: Charlett NoseKevin  Dover M.D.   On: 02/14/2022 01:35   US PELVIC COMPLETE WITH TRANSVAGINAL  Result Date: 02/14/2022 CLINICAL DATA:  Pelvic pain and fever. Vaginal bleeding. Status post vaginal delivery on 01/24/2022. EXAM: TRANSABDOMINAL AND TRANSVAGINAL ULTRASOUND OF  PELVIS TECHNIQUE: Both transabdominal and transvaginal ultrasound examinations of the pelvis were performed. Transabdominal technique was performed for global imaging of the pelvis including uterus, ovaries, adnexal regions, and pelvic cul-de-sac. It was necessary to proceed with endovaginal exam following the transabdominal exam to visualize the endometrium and ovaries. COMPARISON:  Pelvic ultrasound  dated 02/09/2022 and CT abdomen pelvis dated 08/25/2013. FINDINGS: Uterus Measurements: 13.1 x 6.0 x 9.7 cm = volume: 400 mL. No fibroids or other mass visualized. Endometrium Thickness: 2 mm. No focal abnormality visualized. Small amount of fluid noted within the endometrium. Right ovary Measurements: 5.2 x 3.0 x 3.6 cm = volume: 29 mL. Normal appearance/no adnexal mass. Left ovary Measurements: 3.8 x 2.3 x 3.7 cm = volume: 17 mL. Normal appearance/no adnexal mass. Other findings There is a tubular and elongated structure or a loculated collection in the right adnexa extending to the posterior pelvis. There is complex heterogeneous content within this tubular structure without internal vascularity. This may represent an infected collection/abscess or less likely proteinaceous/pyogenic content within a dilated fallopian tube. Small amount of complex free fluid noted in the anterior pelvis with low-level echogenic debris. IMPRESSION: 1. Findings may represent a loculated infected collection/developing abscess versus pyogenic or proteinaceous content within a dilated fallopian tube in the right hemipelvis. 2. Trace fluid within the endometrium. 3. Unremarkable uterus and ovaries. Electronically Signed   By: Elgie Collard M.D.   On: 02/14/2022 01:31    Assessment/Plan: 25yo at 3 weeks postpartum, G1P1001, with pelvic and abdominal pain - Differential diagnosis includes pelvic abcess, pelvic hematoma, salpingitis, PID, ovarian mass, intraabdominal bleed - Broad spectrum antibiotic started - unasyn  - IV fluid hydration - Pain control : discussed r/b of toradol. Dilaudid for now till able to get toradol given recent Ibuprofen  - Discussed with Dr Lowella Dandy (IR) : given complex appearance to collection, recommends CT abdomen/pelvis with IV contrast.to better aid diagnosis - Monitor vitals - Keep NPO   Cathrine Muster 02/14/2022, 5:29 AM

## 2022-02-14 NOTE — Progress Notes (Signed)
Patient ID: Stacie [name], female   DOB: [birth year], 26 y.o.   MRN: [id number] ? ? ?CT ABDOMEN PELVIS W CONTRAST 02/14/2022 ? ?Narrative ?CLINICAL DATA:  Right lower quadrant pain, uterine hematoma, fever ?and vaginal bleeding. Vaginal birth on 01/24/2022. ? ?EXAM: ?CT ABDOMEN AND PELVIS WITH CONTRAST ? ?TECHNIQUE: ?Multidetector CT imaging of the abdomen and pelvis was performed ?using the standard protocol following bolus administration of ?intravenous contrast. ? ?RADIATION DOSE REDUCTION: This exam was performed according to the ?departmental dose-optimization program which includes automated ?exposure control, adjustment of the mA and/or kV according to ?patient size and/or use of iterative reconstruction technique. ? ?CONTRAST:  OMNIPAQUE IOHEXOL 300 MG/ML  SOLN ? ?COMPARISON:  Abdominal and pelvic ultrasound 02/14/2022. CT abdomen ?pelvis 08/25/2013. ? ?FINDINGS: ?Lower chest: Tiny right pleural effusion with adjacent subpleural ?atelectasis. Minimal subpleural atelectasis in the dependent left ?lower lobe. Heart size normal. No pericardial effusion. ? ?Hepatobiliary: Liver and gallbladder are unremarkable. No biliary ?ductal dilatation. ? ?Pancreas: Negative. ? ?Spleen: Negative. ? ?Adrenals/Urinary Tract: Adrenal glands and kidneys are unremarkable. ?Ureters are decompressed. Bladder is grossly unremarkable. ? ?Stomach/Bowel: Stomach, small bowel, appendix and colon are ?unremarkable. ? ?Vascular/Lymphatic: Vascular structures are unremarkable. Scattered ?lymph nodes are not enlarged by CT size criteria. ? ?Reproductive: Uterus is visualized.  No adnexal mass. ? ?Other: Loculated fluid in the anatomic pelvis with hyperattenuating ?peritoneal walls. Additional fluid adjacent to the liver and spleen. ?Presacral edema. ? ?Musculoskeletal: None. ? ?IMPRESSION: ?1. Loculated pelvic fluid with evidence of peritonitis and/or ?abscess in the cul-de-sac. ?2. Perihepatic and perisplenic ascites. ?3. Tiny  right pleural effusion. ? ? ?Electronically Signed ?By: Leanna Battles M.D. ?On: 02/14/2022 16:14 ? ?TC w/ IR physician on-call, imaging reviewed with him. Discussed that location of fluid collection difficult for IR sampling. IR physicians performing procedures will review patient's case and imaging in morning to determine best way to proceed. NPO after midnight in case of procedure tomorrow.  ?Discussed plan with patient and mother about possibility of IR procedure tomorrow versus continued abx ? ?

## 2022-02-15 MED ORDER — POLYSACCHARIDE IRON COMPLEX 150 MG PO CAPS
150.0000 mg | ORAL_CAPSULE | Freq: Every day | ORAL | Status: DC
  Filled 2022-02-15: qty 1

## 2022-02-15 MED ORDER — POLYSACCHARIDE IRON COMPLEX 150 MG PO CAPS: 150.0000 mg | ORAL_CAPSULE | Freq: Every day | ORAL | 3 refills | Status: AC

## 2022-02-15 MED ORDER — AMOXICILLIN-POT CLAVULANATE 875-125 MG PO TABS: 1.0000 | ORAL_TABLET | Freq: Two times a day (BID) | ORAL | 0 refills | Status: AC

## 2022-02-15 MED ORDER — OXYCODONE HCL 5 MG PO TABS: 5.0000 mg | ORAL_TABLET | ORAL | Status: DC | PRN

## 2022-02-15 MED ORDER — ACETAMINOPHEN 500 MG PO TABS
1000.0000 mg | ORAL_TABLET | Freq: Four times a day (QID) | ORAL | Status: DC | PRN
Start: 2022-02-15 — End: 2022-02-15

## 2022-02-15 NOTE — Progress Notes (Signed)
? ? ?  Request made for possible drain placement  ? ?CT yesterday ?IMPRESSION: ?1. Loculated pelvic fluid with evidence of peritonitis and/or ?abscess in the cul-de-sac. ?2. Perihepatic and perisplenic ascites. ?3. Tiny right pleural effusion. ? ?Dr Deanne Coffer has reviewed imaging ?Unfortunately, there is no safe window for drain placement ? ?No drain placement in IR  ?

## 2022-02-15 NOTE — Discharge Instructions (Signed)
Call for repetitive fever or increasing pain ?

## 2022-02-15 NOTE — Discharge Summary (Signed)
Physician Discharge Summary  ?Patient ID: ?Stacie [name] ?MRN: [id number] ?DOB/AGE: 02/01/1996 26 y.o. ? ?Admit date: 02/13/2022 ?Discharge date: 02/15/2022 ? ?Admission Diagnoses:  Postpartum pain with pelvic mass ? ?Discharge Diagnoses: Same, probable pelvic hematoma ?Principal Problem: ?  Postpartum fallopian tube abscess ?Active Problems: ?  Abdominal pain affecting pregnancy ? ? ?Discharged Condition: good ? ?Hospital Course: Pt admitted by Dr. Terri Piedra on PPD #21 for increasing pelvic apin and low grade fever.  Pelvic ultrasound confirmed a 9x2 cm mass right pelvis, similar to what was seen on pelvic u/s in the office on 3-10.  She was started on IV Unasyn for possible infected mass, pain meds, and CT scan ordered and eval by IR for possible drainage.  CT scan was not done until late afternoon on 3-13, but confirmed loculated fluid in the pelvis with hyperattenuating peritoneal walls.  IR felt this area was not amenable to drainage.  However, she remained afebrile and pain improved, and on the afternoon of 3-14 was stable for discharge ? ?Significant Diagnostic Studies: radiology: CT scan: abd/pelvis and Ultrasound: pelvis ? ? ?Discharge Exam: ?Blood pressure 132/60, pulse 99, temperature 98.6 ?F (37 ?C), temperature source Oral, resp. rate 18, height 5\' 10"  (1.778 m), weight 109.9 kg, SpO2 97 %, unknown if currently breastfeeding. ?General appearance: alert ? ?Disposition: Discharge disposition: 01-Home or Self Care ? ? ? ? ? ? ?Discharge Instructions   ? ? Diet - low sodium heart healthy   Complete by: As directed ?  ? ?  ? ?Allergies as of 02/15/2022   ? ?   Reactions  ? Latex Rash  ? ?  ? ?  ?Medication List  ?  ? ?TAKE these medications   ? ?acetaminophen 325 MG tablet ?Commonly known as: Tylenol ?Take 2 tablets (650 mg total) by mouth every 4 (four) hours as needed (for pain scale < 4). ?  ?amoxicillin-clavulanate 875-125 MG tablet ?Commonly known as: Augmentin ?Take 1 tablet by mouth 2 (two) times daily for  6 days. ?  ?ibuprofen 600 MG tablet ?Commonly known as: ADVIL ?Take 1 tablet (600 mg total) by mouth every 6 (six) hours. ?  ?iron polysaccharides 150 MG capsule ?Commonly known as: NIFEREX ?Take 1 capsule (150 mg total) by mouth daily. ?  ?oxycodone 5 MG capsule ?Commonly known as: OXY-IR ?Take 5 mg by mouth every 4 (four) hours as needed. ?  ?prenatal multivitamin Tabs tablet ?Take 1 tablet by mouth daily at 12 noon. ?  ? ?  ? ? Follow-up Information   ? ? Shivaji, Melida Quitter, MD. Schedule an appointment as soon as possible for a visit in 1 week(s).   ?Specialty: Obstetrics and Gynecology ?Contact information: ?[address] ?[address] ?[place] [id number] ?[phone] ? ? ?  ?  ? ?  ?  ? ?  ? ? ?Signed: ?Blane Ohara Caprice Mccaffrey ?02/15/2022, 7:19 PM ? ? ?

## 2022-02-15 NOTE — Plan of Care (Signed)

## 2022-02-15 NOTE — Progress Notes (Signed)
Feeling ok, tolerable pain so far today, has walked around a bit ?Afeb, VSS ? ?Will d/c home ?

## 2022-02-15 NOTE — Progress Notes (Signed)
HD #2, PPD #22 ?Doing ok, pain comes and goes ?Afeb, VSS ? ?I am fairly certain this mass is a retroperitoneal hematoma from her delivery.  No known reason she would have an intraabdominal process but could be something with her tube, but not likely.  If IR can drain this that would be helpful.  I spoke with Dr. Reina Fuse who did an extensive exam last Friday, we do not feel this mass/hematoma is accessible transvaginally for surgical drainage.  Will continue on Unasyn for now, tylenol/IB/oxycodone prn, see what IR says today ?

## 2023-02-17 IMAGING — CT CT ABD-PELV W/ CM
2 of 4 series · 17 of 46 positions shown, 19 images · IV contrast (APPLIED)
Comparison: Abdominal and pelvic ultrasound 02/14/2022. CT abdomen
pelvis 08/25/2013.

CLINICAL DATA: Right lower quadrant pain, uterine hematoma, fever
and vaginal bleeding. Vaginal birth on 01/24/2022.

EXAM:
CT ABDOMEN AND PELVIS WITH CONTRAST
TECHNIQUE: Multidetector CT imaging of the abdomen and pelvis was performed
using the standard protocol following bolus administration of
intravenous contrast.

[Series 3: abd/ pelvis 5.0 i30f 2 · axial · 0.98mm/px · z∈[-818,-313]mm · 14 of 111 slices shown, 16 images]
[im 5/111  soft-tissue]
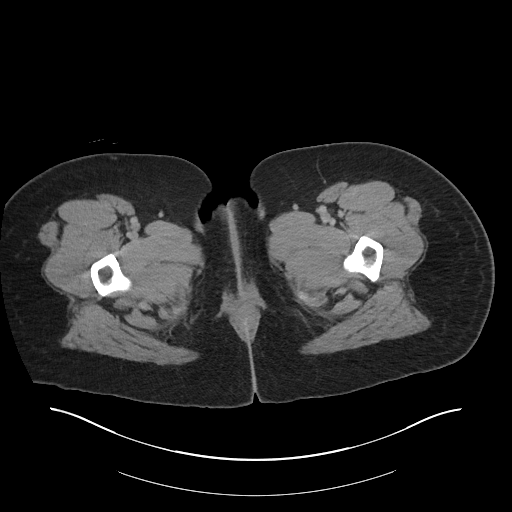
[im 5/111  bone]
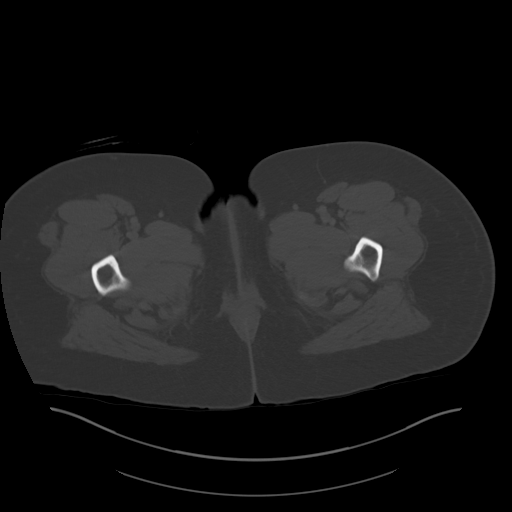
[im 14/111  soft-tissue]
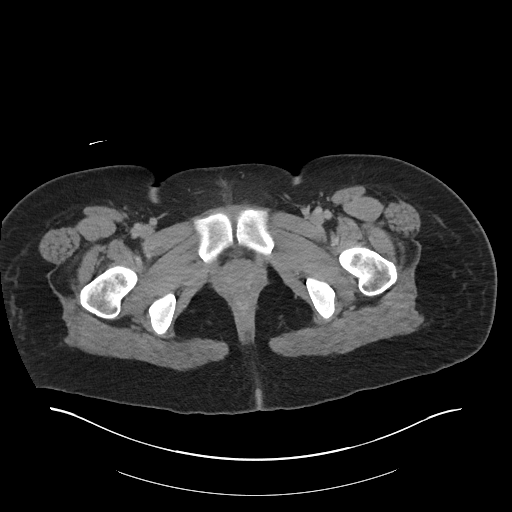
[im 23/111  soft-tissue]
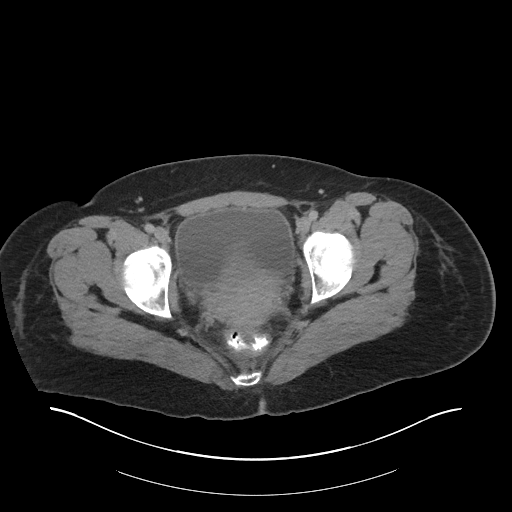
[im 31/111  soft-tissue]
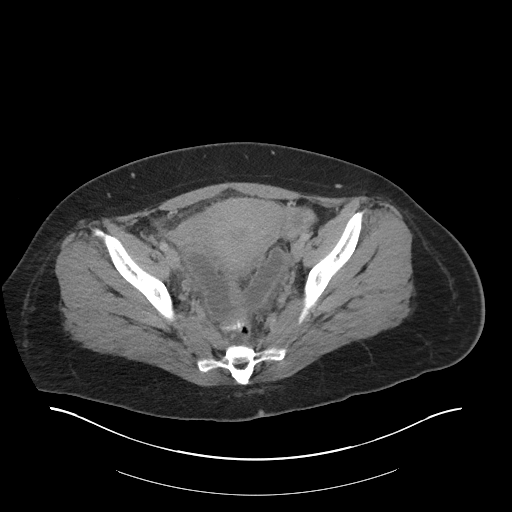
[im 36/111  soft-tissue]
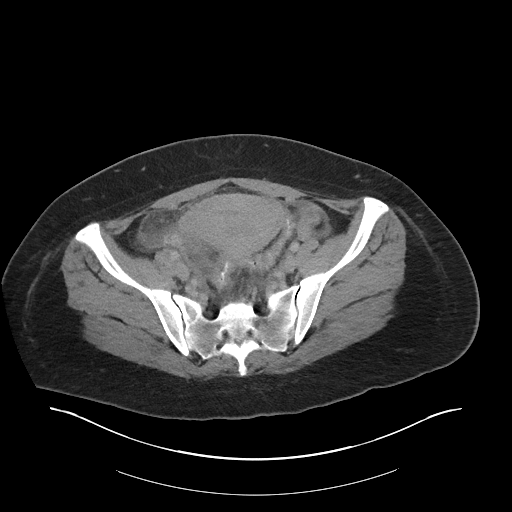
[im 45/111  soft-tissue]
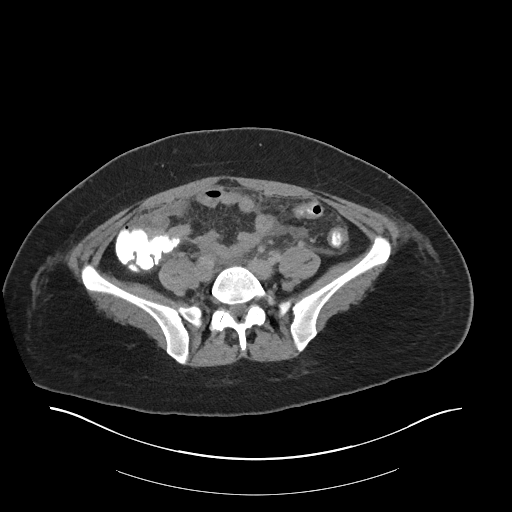
[im 53/111  soft-tissue]
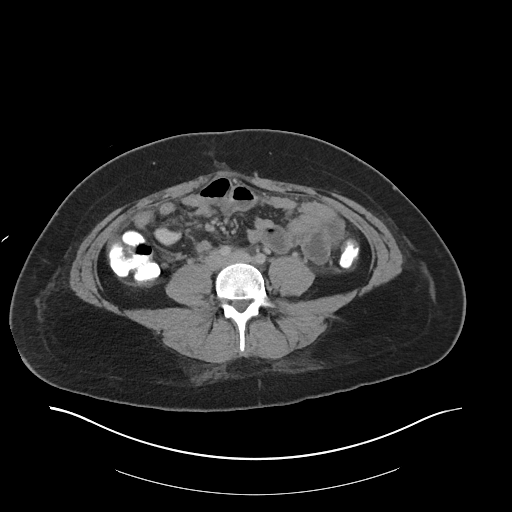
[im 58/111  soft-tissue]
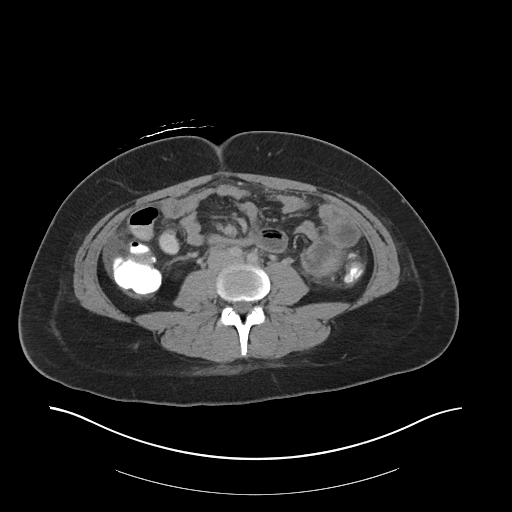
[im 67/111  soft-tissue]
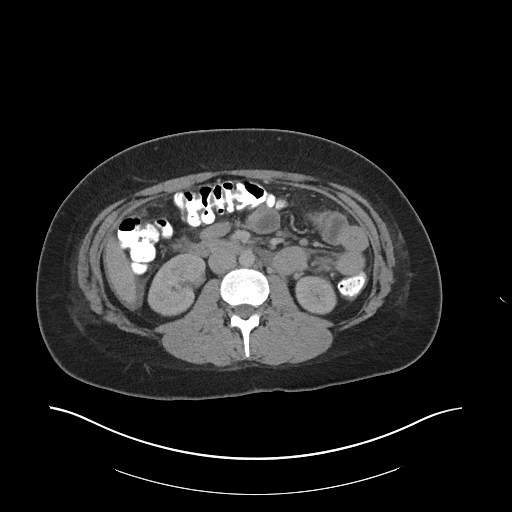
[im 67/111  bone]
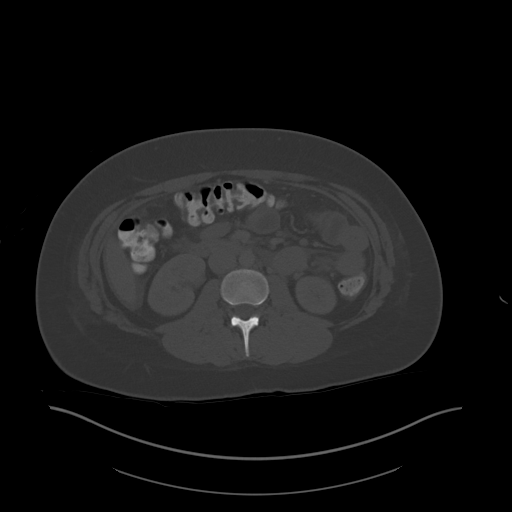
[im 75/111  soft-tissue]
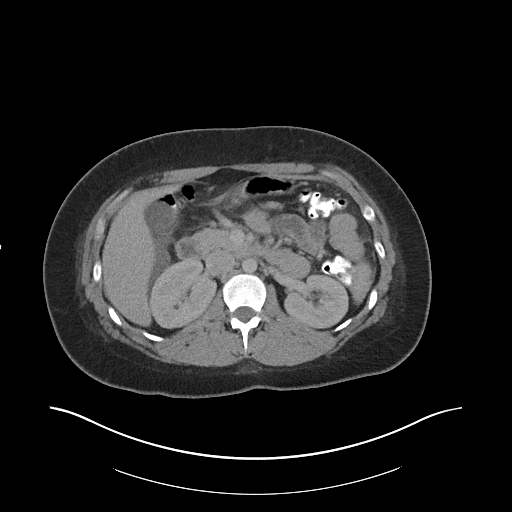
[im 84/111  soft-tissue]
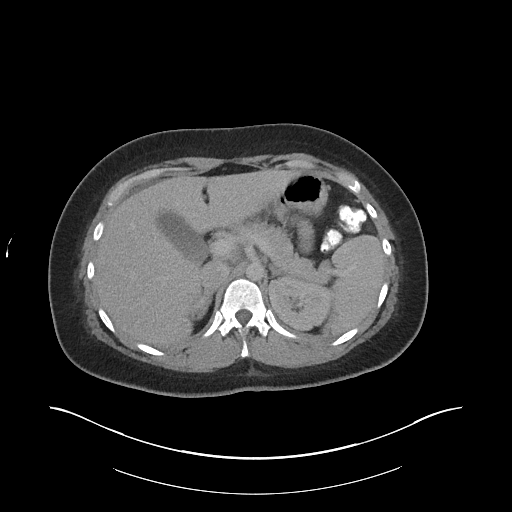
[im 89/111  soft-tissue]
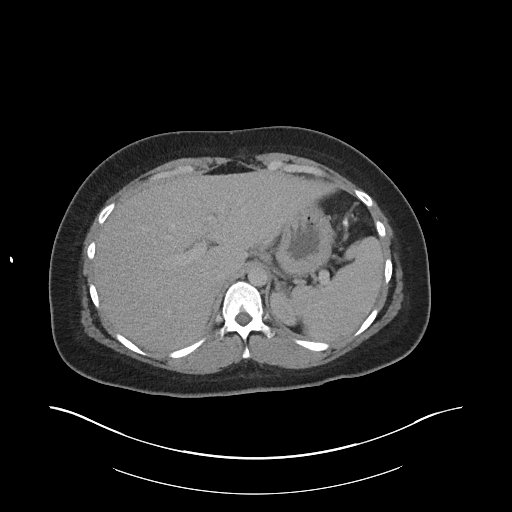
[im 97/111  soft-tissue]
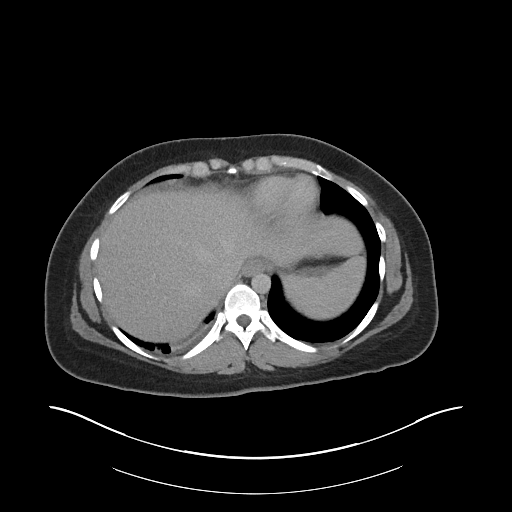
[im 106/111  soft-tissue]
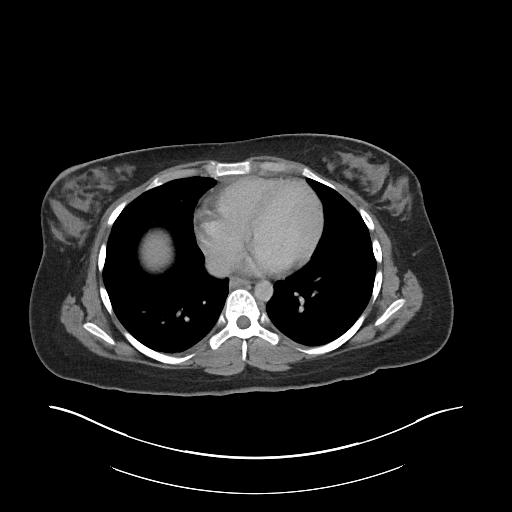

[Series 6: coronal soft tissue · coronal · 1.04mm/px · 3 of 106 slices shown]
[im 36/106  soft-tissue]
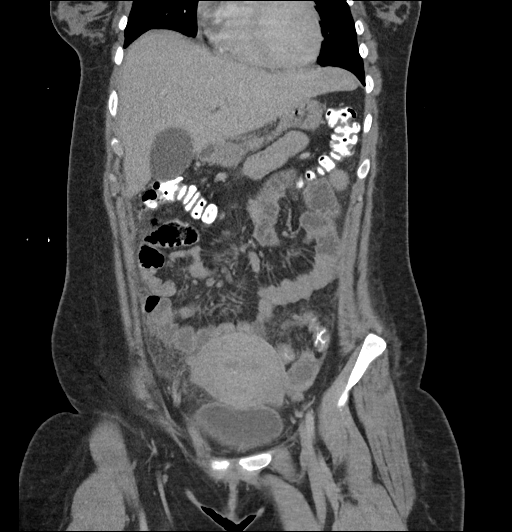
[im 47/106  soft-tissue]
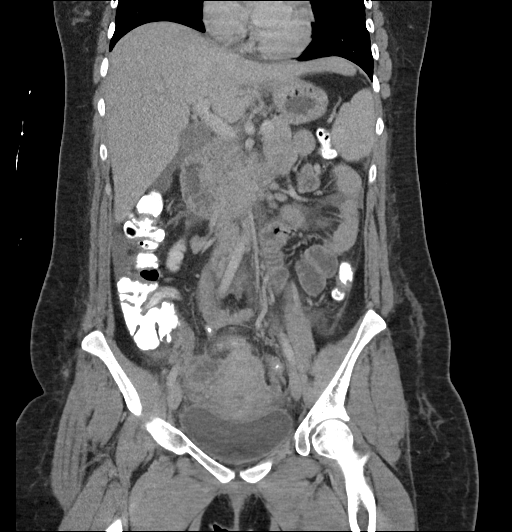
[im 59/106  soft-tissue]
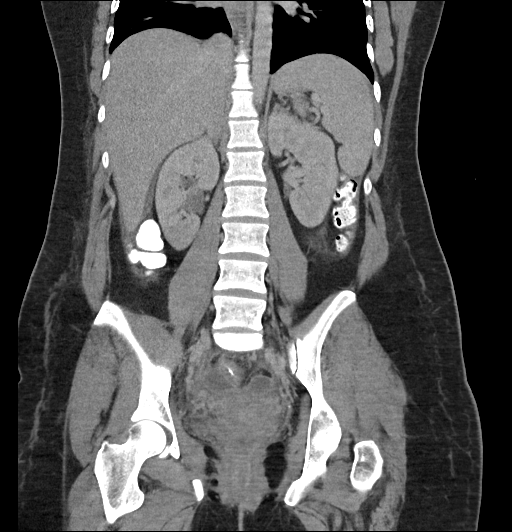

[17 of 46 positions shown; findings below may reference images not displayed]

RADIATION DOSE REDUCTION: This exam was performed according to the
departmental dose-optimization program which includes automated
exposure control, adjustment of the mA and/or kV according to
patient size and/or use of iterative reconstruction technique.

CONTRAST:  100mL OMNIPAQUE IOHEXOL 300 MG/ML  SOLN
FINDINGS: Lower chest: Tiny right pleural effusion with adjacent subpleural
atelectasis. Minimal subpleural atelectasis in the dependent left
lower lobe. Heart size normal. No pericardial effusion.

Hepatobiliary: Liver and gallbladder are unremarkable. No biliary
ductal dilatation.

Pancreas: Negative.

Spleen: Negative.

Adrenals/Urinary Tract: Adrenal glands and kidneys are unremarkable.
Ureters are decompressed. Bladder is grossly unremarkable.

Stomach/Bowel: Stomach, small bowel, appendix and colon are
unremarkable.

Vascular/Lymphatic: Vascular structures are unremarkable. Scattered
lymph nodes are not enlarged by CT size criteria.

Reproductive: Uterus is visualized.  No adnexal mass.

Other: Loculated fluid in the anatomic pelvis with hyperattenuating
peritoneal walls. Additional fluid adjacent to the liver and spleen.
Presacral edema.

Musculoskeletal: None.
IMPRESSION: 1. Loculated pelvic fluid with evidence of peritonitis and/or
abscess in the cul-de-sac.
2. Perihepatic and perisplenic ascites.
3. Tiny right pleural effusion.

## 2023-02-17 IMAGING — US US ABDOMEN LIMITED
1 series · 7 of 7 positions shown · non-contrast
Comparison: Pelvic ultrasound earlier today.

CLINICAL DATA: Pelvic pain.  Three weeks postpartum

EXAM:
LIMITED ABDOMEN ULTRASOUND FOR ASCITES
TECHNIQUE: Limited ultrasound survey for ascites was performed in all four
abdominal quadrants.

[Series 1: us abdomen limited · 7 of 7 slices shown]
[im 1/7]
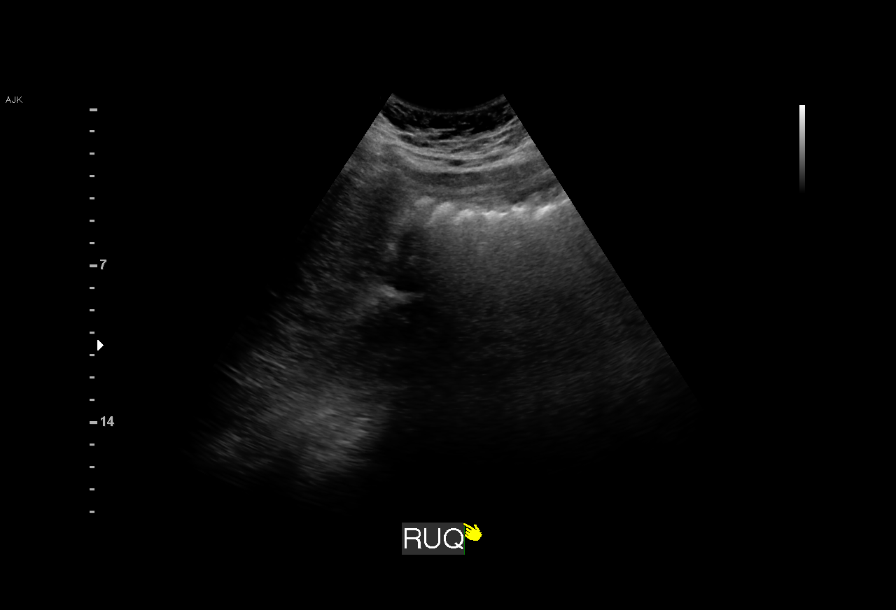
[im 2/7]
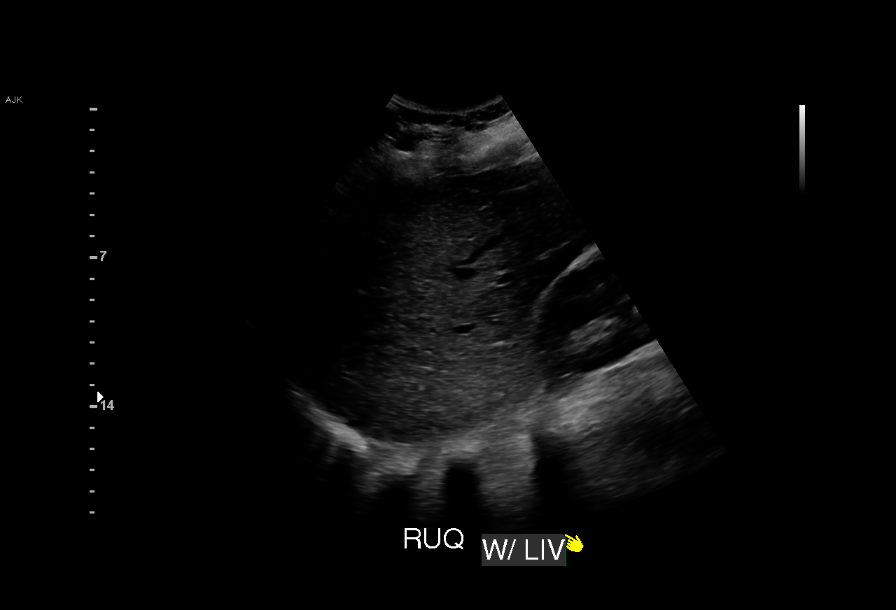
[im 3/7]
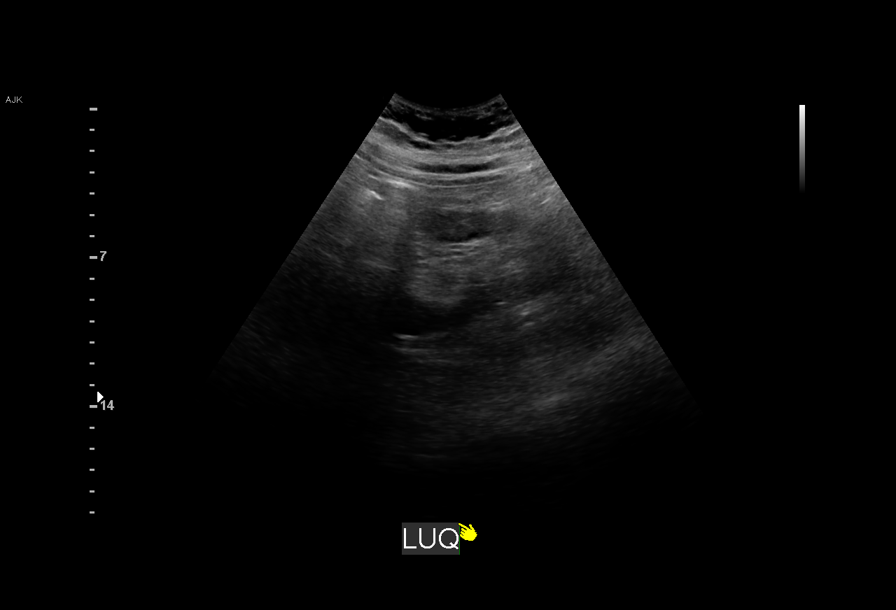
[im 4/7]
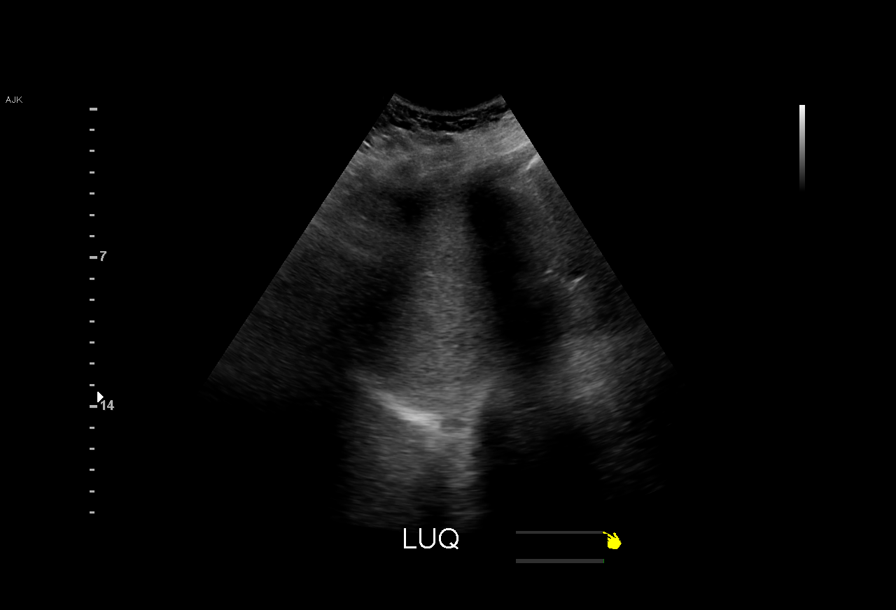
[im 5/7]
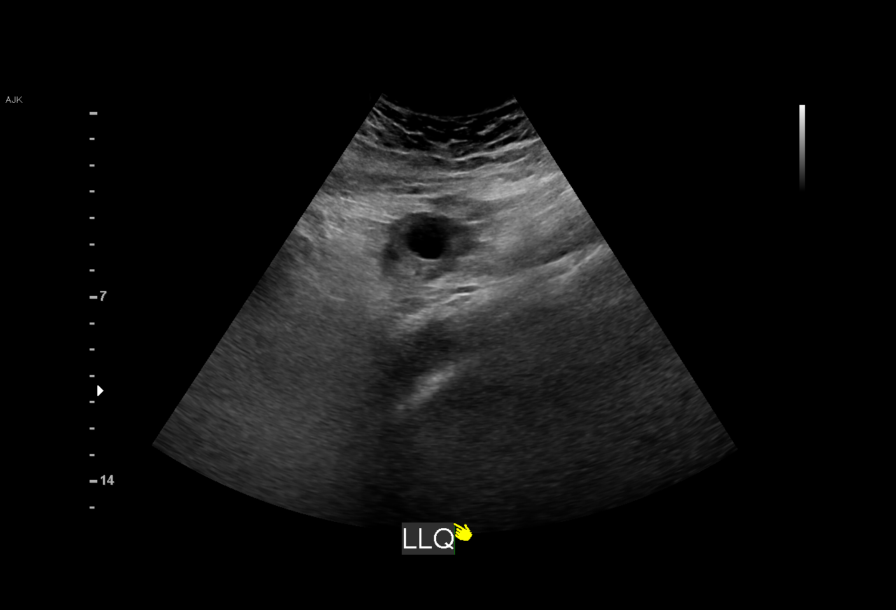
[im 6/7]
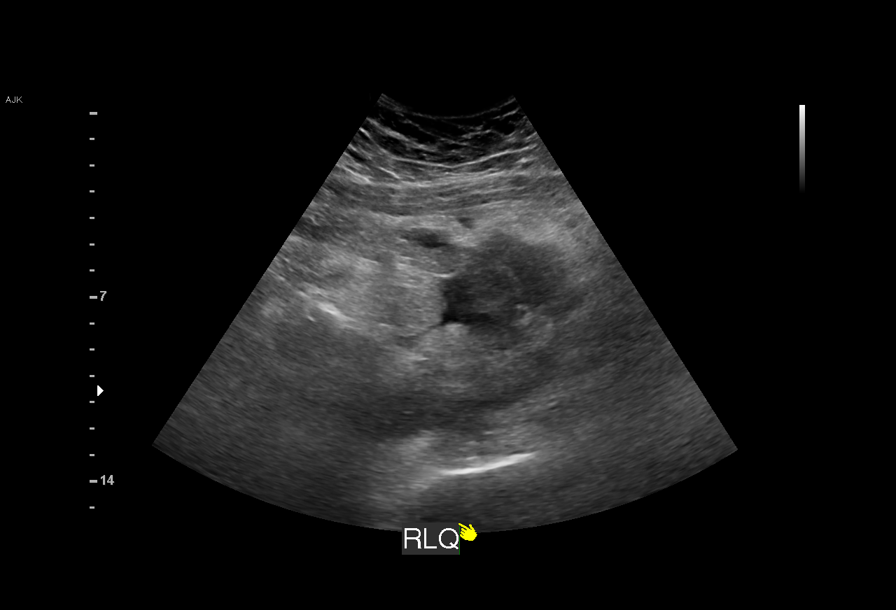
[im 7/7]
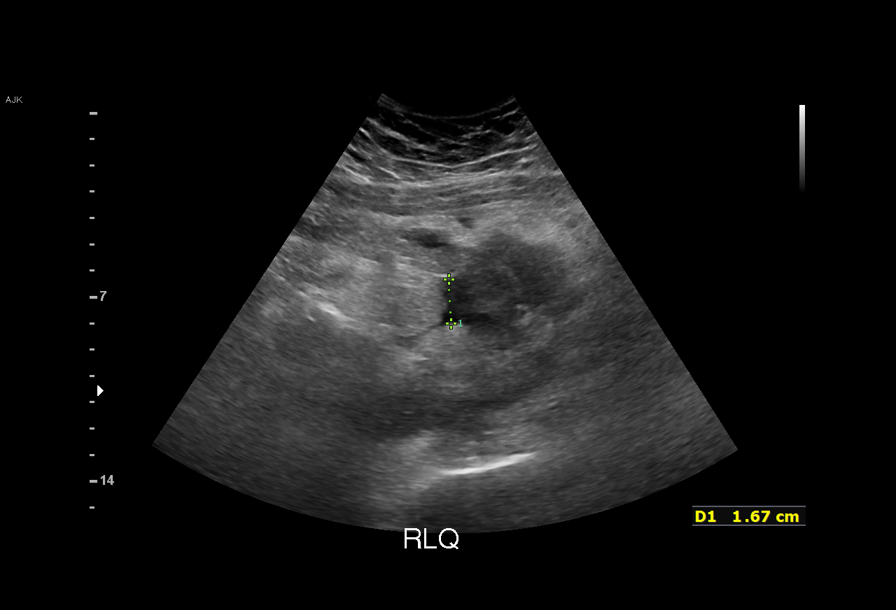

[7 of 7 positions shown; findings below may reference images not displayed]

FINDINGS: Trace free fluid in the right lower quadrant. Otherwise no free
fluid visualized in the abdomen and pelvis.
IMPRESSION: Trace free fluid in the right lower quadrant.

## 2023-02-17 IMAGING — US US PELVIS COMPLETE WITH TRANSVAGINAL
2 series · 15 of 25 positions shown · non-contrast
Comparison: Pelvic ultrasound dated 02/09/2022 and CT abdomen
pelvis dated 08/25/2013.

CLINICAL DATA: Pelvic pain and fever. Vaginal bleeding. Status post
vaginal delivery on 01/24/2022.

EXAM:
TRANSABDOMINAL AND TRANSVAGINAL ULTRASOUND OF PELVIS
TECHNIQUE: Both transabdominal and transvaginal ultrasound examinations of the
pelvis were performed. Transabdominal technique was performed for
global imaging of the pelvis including uterus, ovaries, adnexal
regions, and pelvic cul-de-sac. It was necessary to proceed with
endovaginal exam following the transabdominal exam to visualize the
endometrium and ovaries.

[Series 1: us pelvis complete with transvaginal · 8 of 39 slices shown (1 of 2)]
[im 1/39]
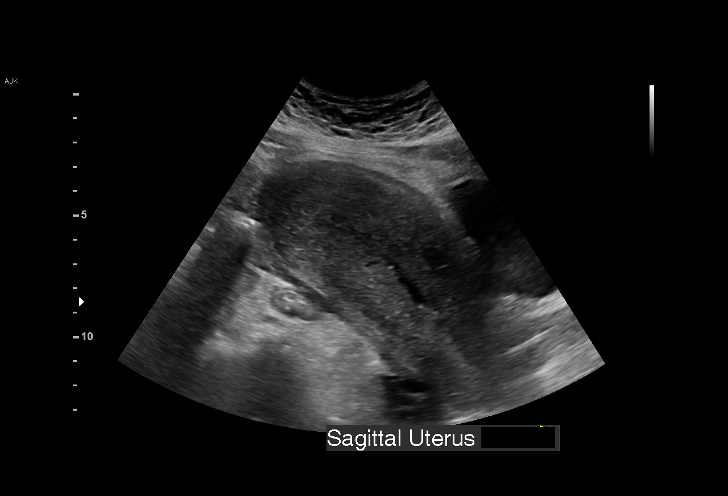
[im 7/39]
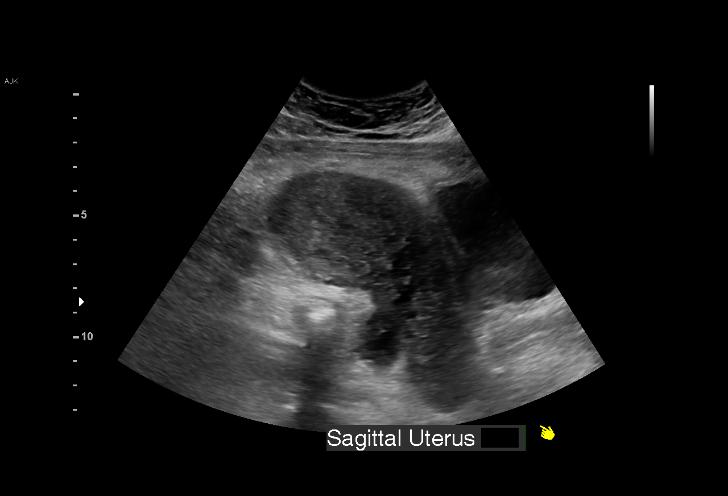
[im 13/39]
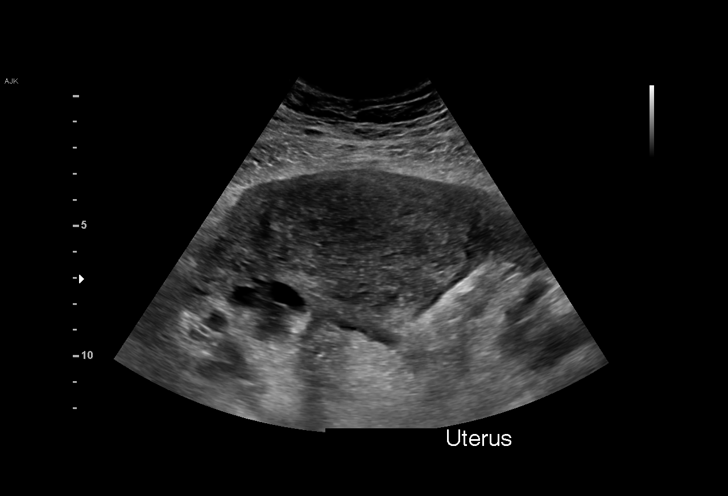
[im 16/39]
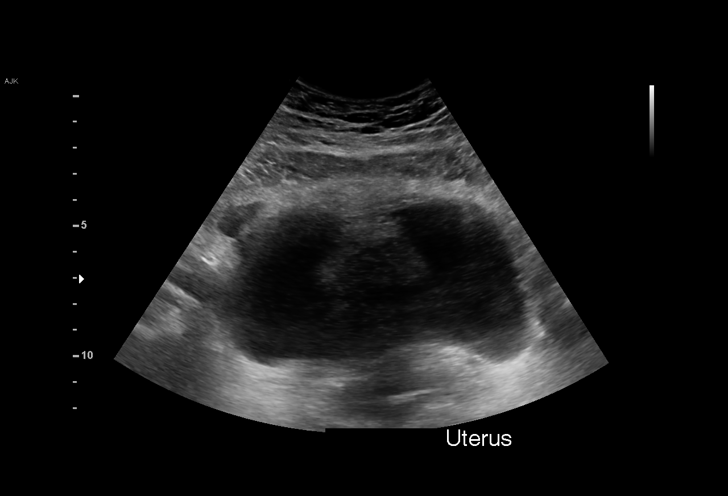
[im 23/39]
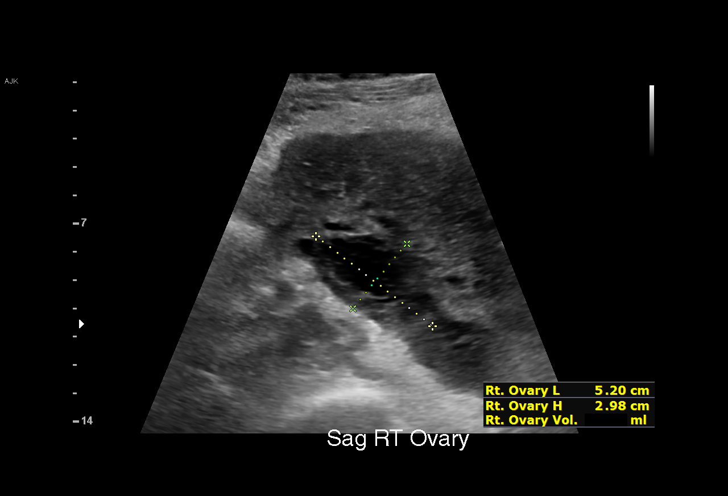
[im 29/39]
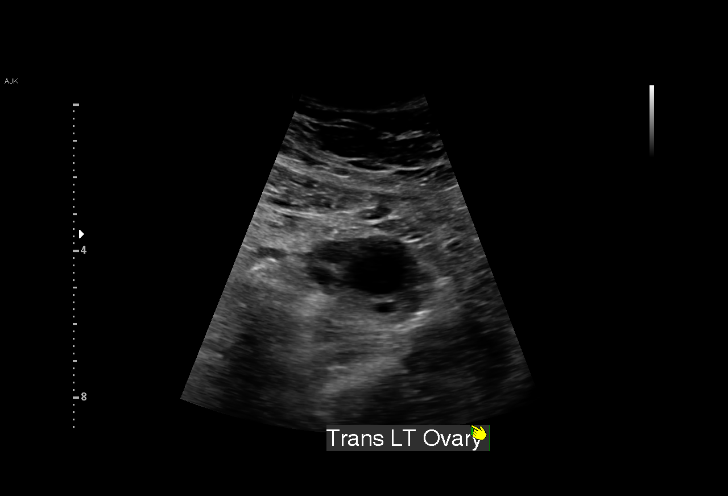
[im 32/39]
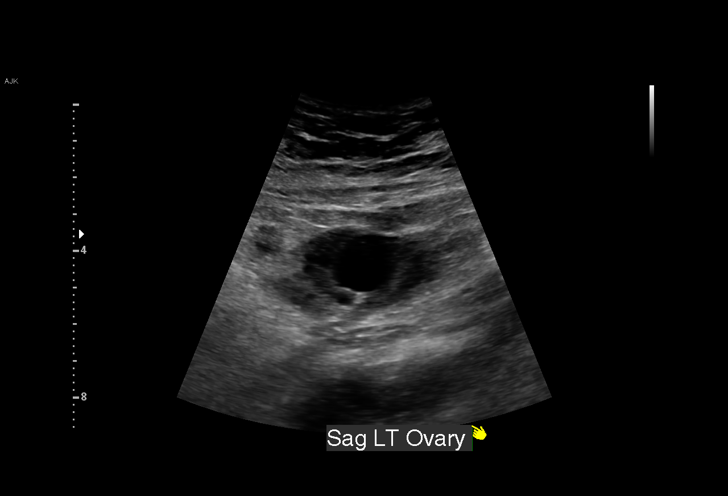
[im 39/39]
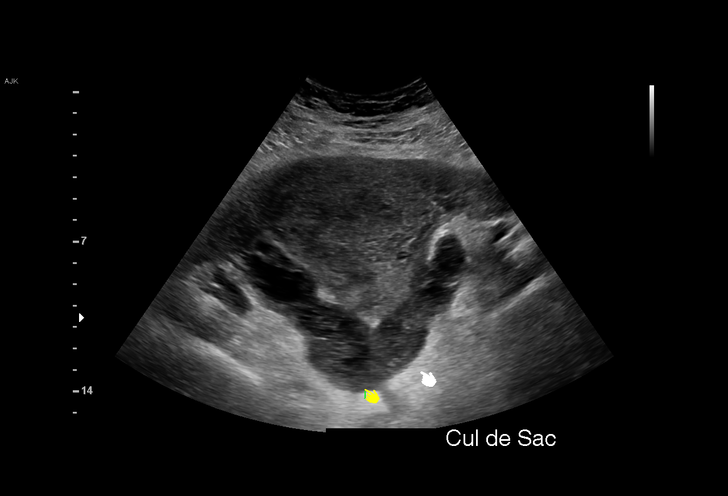

[Series 3: us pelvis complete with transvaginal · 39 acquisitions, 7 frames shown (2 of 2)]
[im 4/39]
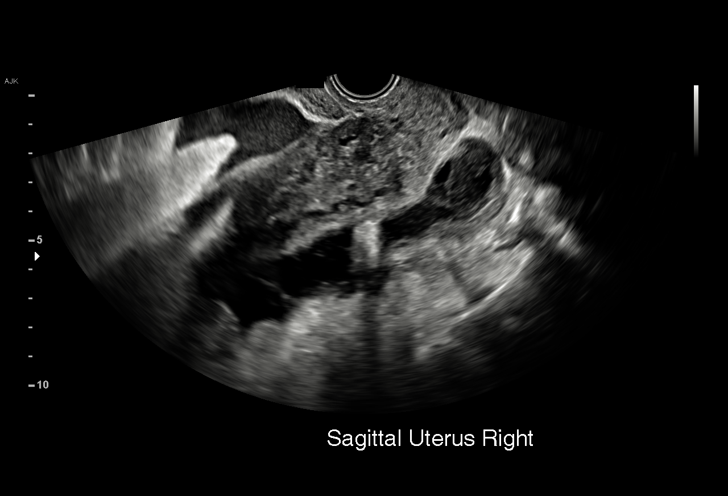
[im 7/39]
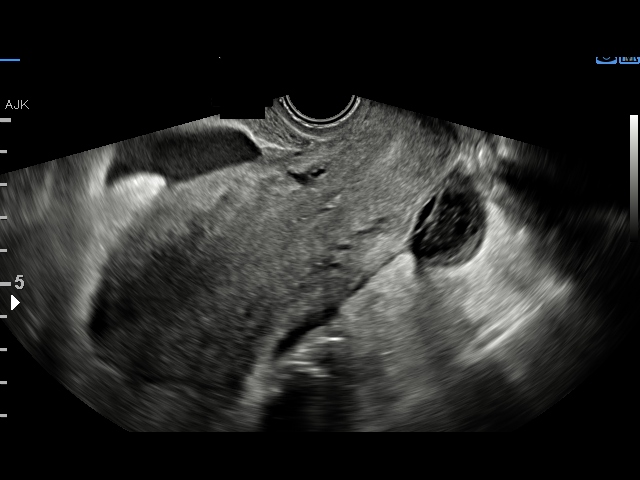
[im 14/39]
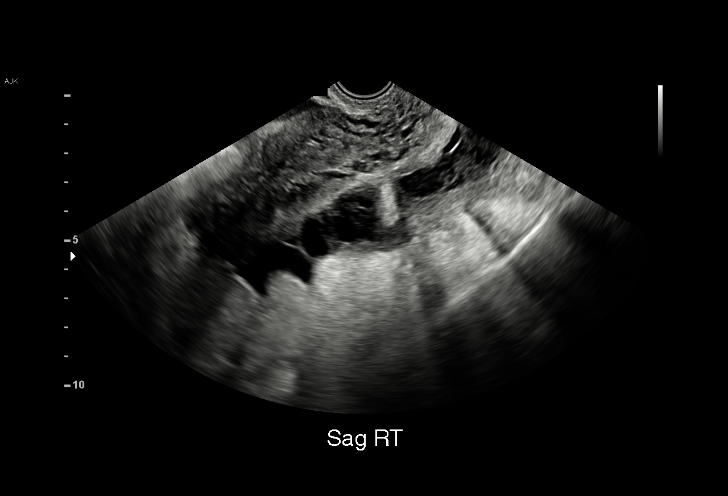
[im 21/39]
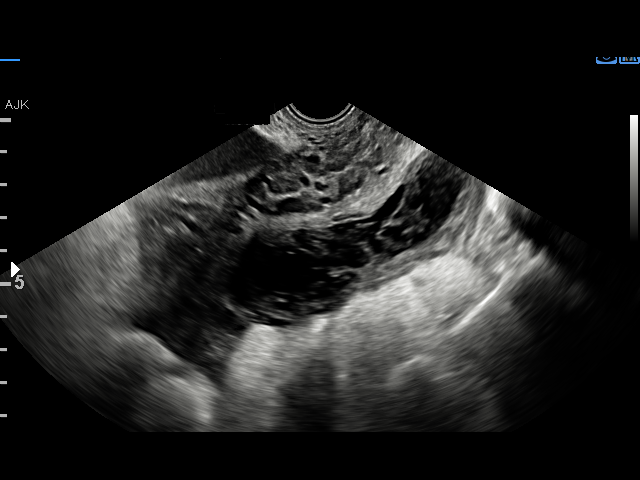
[im 25/39]
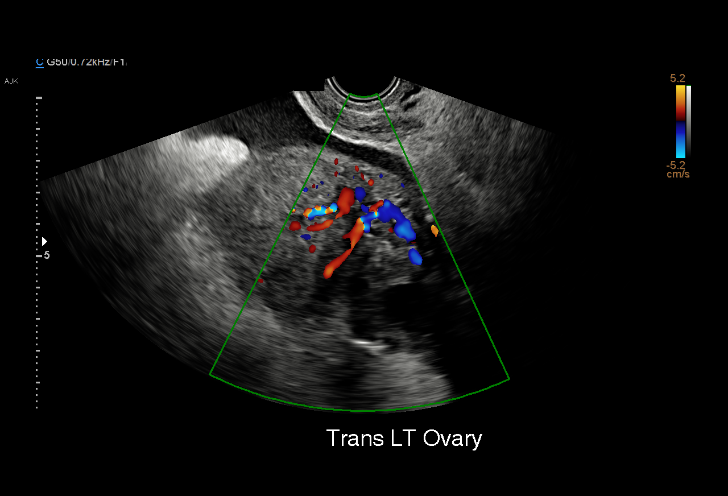
[im 32/39]
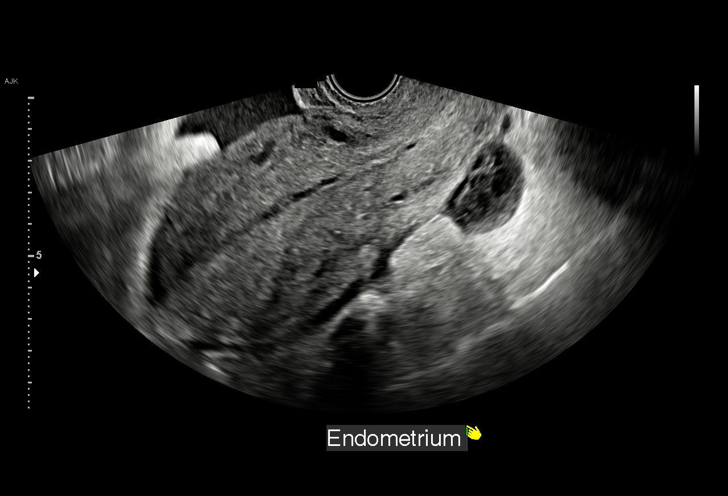
[im 39/39]
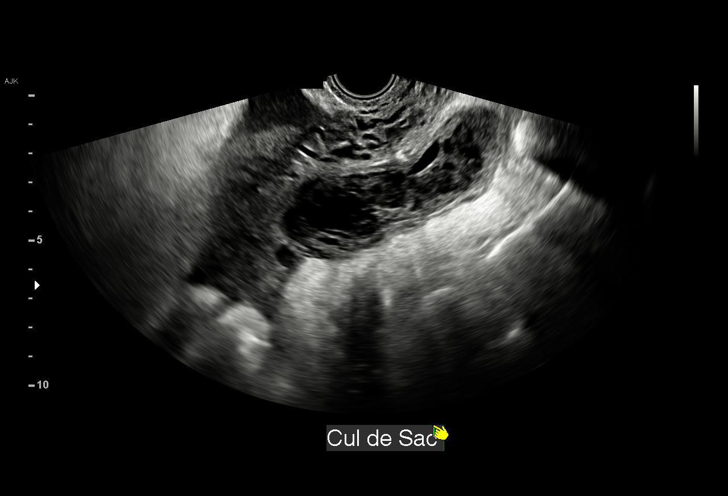

[15 of 25 positions shown; findings below may reference images not displayed]

FINDINGS: Uterus

Measurements: 13.1 x 6.0 x 9.7 cm = volume: 400 mL. No fibroids or
other mass visualized.

Endometrium

Thickness: 2 mm. No focal abnormality visualized. Small amount of
fluid noted within the endometrium.

Right ovary

Measurements: 5.2 x 3.0 x 3.6 cm = volume: 29 mL. Normal
appearance/no adnexal mass.

Left ovary

Measurements: 3.8 x 2.3 x 3.7 cm = volume: 17 mL. Normal
appearance/no adnexal mass.

Other findings

There is a tubular and elongated structure or a loculated collection
in the right adnexa extending to the posterior pelvis. There is
complex heterogeneous content within this tubular structure without
internal vascularity. This may represent an infected
collection/abscess or less likely proteinaceous/pyogenic content
within a dilated fallopian tube.

Small amount of complex free fluid noted in the anterior pelvis with
low-level echogenic debris.
IMPRESSION: 1. Findings may represent a loculated infected collection/developing
abscess versus pyogenic or proteinaceous content within a dilated
fallopian tube in the right hemipelvis.
2. Trace fluid within the endometrium.
3. Unremarkable uterus and ovaries.

## 2023-03-05 ENCOUNTER — Emergency Department (HOSPITAL_COMMUNITY)
Admission: EM | Admit: 2023-03-05 | Discharge: 2023-03-05 | Discharge status: Against Medical Advice | Payer: Medicaid Other | Source: Home / Self Care
